# Patient Record
Sex: Male | Born: 1990 | Race: Black or African American | Hispanic: No | Marital: Single | Smoking: Never smoker
Health system: Southern US, Community
[De-identification: ages and names within clinical notes are randomized; demographics above are authoritative.]

## PROBLEM LIST (undated history)

## (undated) HISTORY — PX: EYE SURGERY: SHX253

---

## 2015-07-20 ENCOUNTER — Emergency Department (HOSPITAL_COMMUNITY): Payer: Medicaid - Out of State

## 2015-07-20 ENCOUNTER — Encounter (HOSPITAL_COMMUNITY): Payer: Self-pay | Admitting: *Deleted

## 2015-07-20 ENCOUNTER — Emergency Department (HOSPITAL_COMMUNITY)
Admission: EM | Admit: 2015-07-20 | Discharge: 2015-07-20 | Disposition: A | Discharge status: Home / Self Care | Payer: Medicaid - Out of State | Attending: Emergency Medicine | Admitting: Emergency Medicine

## 2015-07-20 DIAGNOSIS — F17200 Nicotine dependence, unspecified, uncomplicated: Secondary | ICD-10-CM | POA: Insufficient documentation

## 2015-07-20 DIAGNOSIS — N50811 Right testicular pain: Secondary | ICD-10-CM

## 2015-07-20 LAB — URINALYSIS, ROUTINE W REFLEX MICROSCOPIC
BILIRUBIN URINE: NEGATIVE
GLUCOSE, UA: NEGATIVE mg/dL
HGB URINE DIPSTICK: NEGATIVE
Ketones, ur: NEGATIVE mg/dL
Leukocytes, UA: NEGATIVE
Nitrite: NEGATIVE
PH: 7 (ref 5.0–8.0)
Protein, ur: NEGATIVE mg/dL
SPECIFIC GRAVITY, URINE: 1.014 (ref 1.005–1.030)

## 2015-07-20 NOTE — ED Notes (Signed)
See PA assessment, PA at bedside with RN 

## 2015-07-20 NOTE — ED Provider Notes (Signed)
CSN: [id number]   Arrival date & time 07/20/15 1212  History  By signing my name below, I, Bethel Born, attest that this documentation has been prepared under the direction and in the presence of Sharilyn Sites PA-C Electronically Signed: Bethel Born, ED Scribe. 07/20/2015. 1:20 PM. Chief Complaint  Patient presents with  . Testicle Pain    HPI The history is provided by the patient. No language interpreter was used.   Brad Mayo is a 24 y.o. male who presents to the Emergency Department complaining of new, atraumatic, intermittent, 6/10 in severity, burning, right-sided testicular pain with onset 2 days ago. The pain worsened after sexual intercourse.  Pt denies left-sided testicular pain, dysuria, penile discharge, and rash. He would like to be checked for STDs.  No hx of STD.  No intervention tried PTA.  VSS.  History reviewed. No pertinent past medical history.  History reviewed. No pertinent past surgical history.  History reviewed. No pertinent family history.  Social History  Substance Use Topics  . Smoking status: Current Every Day Smoker  . Smokeless tobacco: None  . Alcohol Use: None     Review of Systems  Genitourinary: Positive for testicular pain. Negative for dysuria, discharge, genital sores and penile pain.  Skin: Negative for rash.  All other systems reviewed and are negative.  Home Medications   Prior to Admission medications   Not on File    Allergies  Review of patient's allergies indicates no known allergies.  Triage Vitals: BP 141/76 mmHg  Pulse 56  Temp(Src) 97.9 F (36.6 C) (Oral)  Resp 18  Ht  (2.032 m)  Wt 229 lb 1.6 oz (103.919 kg)  BMI 25.17 kg/m2  SpO2 100%  Physical Exam  Constitutional: He is oriented to person, place, and time. He appears well-developed and well-nourished. No distress.  HENT:  Head: Normocephalic and atraumatic.  Mouth/Throat: Oropharynx is clear and moist.  Eyes: Conjunctivae and EOM are normal.  Pupils are equal, round, and reactive to light.  Neck: Normal range of motion. Neck supple.  Cardiovascular: Normal rate, regular rhythm and normal heart sounds.   Pulmonary/Chest: Effort normal and breath sounds normal. No respiratory distress. He has no wheezes.  Abdominal: Soft. Bowel sounds are normal. There is no tenderness. There is no guarding.  Genitourinary: Penis normal.    Right testis shows tenderness. Right testis shows no mass and no swelling. Right testis is descended. Cremasteric reflex is not absent on the right side. Left testis shows no tenderness. Circumcised. No penile erythema. No discharge found.  Very mild TTP of right lateral testicle; no masses or lesions noted; no swelling; normal lie; no penile discharge noted; no hernia  Musculoskeletal: Normal range of motion.  Neurological: He is alert and oriented to person, place, and time.  Skin: Skin is warm and dry. He is not diaphoretic.  Psychiatric: He has a normal mood and affect.  Nursing note and vitals reviewed.   ED Course  Procedures  DIAGNOSTIC STUDIES: Oxygen Saturation is 100% on RA,  normal by my interpretation.    COORDINATION OF CARE: 1:17 PM Discussed treatment plan which includes lab work and US of the scrotum, with pt at bedside and pt agreed to the plan.  Labs Review-  Labs Reviewed  URINALYSIS, ROUTINE W REFLEX MICROSCOPIC (NOT AT [facility])  GC/CHLAMYDIA PROBE AMP ([place]) NOT AT [facility]    Imaging Review US Scrotum  07/20/2015  CLINICAL DATA:  Right testicular pain for 3 days. EXAM: SCROTAL ULTRASOUND DOPPLER  ULTRASOUND OF THE TESTICLES TECHNIQUE: Complete ultrasound examination of the testicles, epididymis, and other scrotal structures was performed. Color and spectral Doppler ultrasound were also utilized to evaluate blood flow to the testicles. COMPARISON:  None. FINDINGS: Right testicle Measurements: 4.2 x 2.2 x 3.0 cm. No mass or microlithiasis visualized. Left testicle Measurements: 3.9 x  1.9 x 2.6 cm. No mass or microlithiasis visualized. Right epididymis:  Normal in size and appearance. Left epididymis:  Normal in size and appearance. Hydrocele:  None visualized. Varicocele:  There is bilateral varicocele. Pulsed Doppler interrogation of both testes demonstrates normal low resistance arterial and venous waveforms bilaterally. IMPRESSION: Normal appearance of bilateral testes. Bilateral varicocele. Electronically Signed   By: Ted Mcalpineobrinka  Dimitrova M.D.   On: 07/20/2015 13:59   [place] Art/ven Flow Abd Pelv Doppler  07/20/2015  CLINICAL DATA:  Right testicular pain for 3 days. EXAM: SCROTAL ULTRASOUND DOPPLER ULTRASOUND OF THE TESTICLES TECHNIQUE: Complete ultrasound examination of the testicles, epididymis, and other scrotal structures was performed. Color and spectral Doppler ultrasound were also utilized to evaluate blood flow to the testicles. COMPARISON:  None. FINDINGS: Right testicle Measurements: 4.2 x 2.2 x 3.0 cm. No mass or microlithiasis visualized. Left testicle Measurements: 3.9 x 1.9 x 2.6 cm. No mass or microlithiasis visualized. Right epididymis:  Normal in size and appearance. Left epididymis:  Normal in size and appearance. Hydrocele:  None visualized. Varicocele:  There is bilateral varicocele. Pulsed Doppler interrogation of both testes demonstrates normal low resistance arterial and venous waveforms bilaterally. IMPRESSION: Normal appearance of bilateral testes. Bilateral varicocele. Electronically Signed   By: Ted Mcalpineobrinka  Dimitrova M.D.   On: 07/20/2015 13:59    MDM   Final diagnoses:  Pain in right testicle   24 year old male here with right testicle pain. States this began after intercourse. Denies any penile discharge or urinary symptoms. No deformities noted on exam. Normal lie. No lesions or open wounds. UA non-infectious. Gc/chl pending.  U/s negative for acute torsion or other acute process, bilateral varicoceles noted.  Patient d/c home with urology follow-up if  needed for recurrent testicle pain.  Encouraged tylenol/motrin PRN.  Advised he will be notified in the next 2-3 days if culture results are positive.  Discussed plan with patient, he/she acknowledged understanding and agreed with plan of care.  Return precautions given for new or worsening symptoms.  I personally performed the services described in this documentation, which was scribed in my presence. The recorded information has been reviewed and is accurate.  Garlon HatchetLisa M Shaft Corigliano, PA-C 07/20/15 1440  Leta BaptistEmily Roe Nguyen, MD 07/20/15 2352

## 2015-07-20 NOTE — Discharge Instructions (Signed)
May take tylenol or motrin as needed for pain. You will be notified if cultures are positive-- if so, you will be given antibiotics. May follow-up with urology if you continue have issues with testicle pain. Return here for new concerns.

## 2015-07-20 NOTE — ED Notes (Addendum)
Pt in c/o right testicle burning that has been intermittent for the past few days, increased after intercourse, denies penile discharge, denies redness or swelling, no distress noted

## 2015-07-20 NOTE — ED Notes (Signed)
Pt in ultrasound

## 2015-07-23 LAB — GC/CHLAMYDIA PROBE AMP (~~LOC~~) NOT AT ARMC
Chlamydia: NEGATIVE
Neisseria Gonorrhea: NEGATIVE

## 2015-08-02 ENCOUNTER — Encounter (HOSPITAL_COMMUNITY): Payer: Self-pay | Admitting: Emergency Medicine

## 2015-08-02 ENCOUNTER — Emergency Department (HOSPITAL_COMMUNITY)
Admission: EM | Admit: 2015-08-02 | Discharge: 2015-08-02 | Disposition: A | Discharge status: Home / Self Care | Payer: Medicaid - Out of State | Attending: Emergency Medicine | Admitting: Emergency Medicine

## 2015-08-02 DIAGNOSIS — R42 Dizziness and giddiness: Secondary | ICD-10-CM | POA: Insufficient documentation

## 2015-08-02 DIAGNOSIS — R110 Nausea: Secondary | ICD-10-CM | POA: Insufficient documentation

## 2015-08-02 LAB — URINALYSIS, ROUTINE W REFLEX MICROSCOPIC
Bilirubin Urine: NEGATIVE
Glucose, UA: NEGATIVE mg/dL
Hgb urine dipstick: NEGATIVE
Ketones, ur: NEGATIVE mg/dL
Leukocytes, UA: NEGATIVE
Nitrite: NEGATIVE
Protein, ur: NEGATIVE mg/dL
Specific Gravity, Urine: 1.02 (ref 1.005–1.030)
pH: 6 (ref 5.0–8.0)

## 2015-08-02 LAB — COMPREHENSIVE METABOLIC PANEL
ALT: 16 U/L — ABNORMAL LOW (ref 17–63)
AST: 25 U/L (ref 15–41)
Albumin: 4.1 g/dL (ref 3.5–5.0)
Alkaline Phosphatase: 72 U/L (ref 38–126)
Anion gap: 8 (ref 5–15)
BUN: 12 mg/dL (ref 6–20)
CO2: 28 mmol/L (ref 22–32)
Calcium: 10 mg/dL (ref 8.9–10.3)
Chloride: 103 mmol/L (ref 101–111)
Creatinine, Ser: 1.12 mg/dL (ref 0.61–1.24)
GFR calc Af Amer: >60 mL/min (ref >60)
GFR calc non Af Amer: >60 mL/min (ref >60)
Glucose, Bld: 92 mg/dL (ref 65–99)
Potassium: 3.8 mmol/L (ref 3.5–5.1)
Sodium: 139 mmol/L (ref 135–145)
Total Bilirubin: 1.1 mg/dL (ref 0.3–1.2)
Total Protein: 7.5 g/dL (ref 6.5–8.1)

## 2015-08-02 LAB — CBC
HCT: 45.8 % (ref 39.0–52.0)
Hemoglobin: 15.5 g/dL (ref 13.0–17.0)
MCH: 28.1 pg (ref 26.0–34.0)
MCHC: 33.8 g/dL (ref 30.0–36.0)
MCV: 83 fL (ref 78.0–100.0)
Platelets: 168 10*3/uL (ref 150–400)
RBC: 5.52 MIL/uL (ref 4.22–5.81)
RDW: 13.2 % (ref 11.5–15.5)
WBC: 4.9 10*3/uL (ref 4.0–10.5)

## 2015-08-02 LAB — LIPASE, BLOOD: Lipase: 20 U/L (ref 11–51)

## 2015-08-02 MED ORDER — PROMETHAZINE HCL 25 MG PO TABS: 25.0000 mg | ORAL_TABLET | Freq: Four times a day (QID) | ORAL | Status: DC | PRN

## 2015-08-02 MED ORDER — MECLIZINE HCL 50 MG PO TABS: 25.0000 mg | ORAL_TABLET | Freq: Four times a day (QID) | ORAL | Status: DC | PRN

## 2015-08-02 MED ORDER — PROMETHAZINE HCL 12.5 MG PO TABS
12.5000 mg | ORAL_TABLET | Freq: Once | ORAL | Status: DC
  Filled 2015-08-02: qty 1

## 2015-08-02 NOTE — ED Notes (Signed)
Pt states 1 week ago while working out he became very dizzy since then  He was had intermittent dizziness that comes and goes. 3 days ago pt states he was so dizzy he became nauseous and has been vomiting after eating every day after. No vomitting today. Denies any abdominal pain. Pt states every time he has stood up today he has gotten dizzy.no headache or change in vision.

## 2015-08-02 NOTE — Discharge Instructions (Signed)

## 2015-08-04 NOTE — ED Provider Notes (Signed)
CSN: [id number]     Arrival date & time 08/02/15  1634 History   First MD Initiated Contact with Patient 08/02/15 1858     Chief Complaint  Patient presents with  . Dizziness     (Consider location/radiation/quality/duration/timing/severity/associated sxs/prior Treatment) HPI   24 year old male with dizziness. Onset about a week ago. First felt shortly after he worked out. He felt fine during her short but self. Since then he has had intermittent episodes of what he calls dizziness. Describes sensation of feeling off balance. Nauseated. Sensation that the room is spinning. Seems to be worse with changes in position. Denies any acute pain. No fevers or chills. No ear fullness. No tenderness. No history similar type symptoms. History reviewed. No pertinent past medical history. History reviewed. No pertinent past surgical history. No family history on file. Social History  Substance Use Topics  . Smoking status: Never Smoker   . Smokeless tobacco: None  . Alcohol Use: No    Review of Systems  All systems reviewed and negative, other than as noted in HPI.   Allergies  Review of patient's allergies indicates no known allergies.  Home Medications   Prior to Admission medications   Medication Sig Start Date End Date Taking? Authorizing Provider  meclizine (ANTIVERT) 50 MG tablet Take 0.5 tablets (25 mg total) by mouth 4 (four) times daily as needed for dizziness. 08/02/15   Raeford RazorStephen Velmer Woelfel, MD  promethazine (PHENERGAN) 25 MG tablet Take 1 tablet (25 mg total) by mouth every 6 (six) hours as needed for nausea or vomiting. 08/02/15   Raeford RazorStephen Dae Antonucci, MD   BP 134/70 mmHg  Pulse 56  Temp(Src) 98.1 F (36.7 C) (Oral)  Resp 16  Ht 6\' 8"  (2.032 m)  Wt 230 lb (104.327 kg)  BMI 25.27 kg/m2  SpO2 98% Physical Exam  Constitutional: He is oriented to person, place, and time. He appears well-developed and well-nourished. No distress.  HENT:  Head: Normocephalic and atraumatic.  Tympanic  membranes and external auditory canals are clear bilaterally.  Eyes: Conjunctivae are normal. Right eye exhibits no discharge. Left eye exhibits no discharge.  Neck: Neck supple.  Cardiovascular: Normal rate, regular rhythm and normal heart sounds.  Exam reveals no gallop and no friction rub.   No murmur heard. Pulmonary/Chest: Effort normal and breath sounds normal. No respiratory distress.  Abdominal: Soft. He exhibits no distension. There is no tenderness.  Musculoskeletal: He exhibits no edema or tenderness.  Neurological: He is alert and oriented to person, place, and time. No cranial nerve deficit. He exhibits normal muscle tone. Coordination normal.  Speech clear. Content appropriate. Follows commands. Cranial nerves II through XII are intact. Strength is 5 out of 5 bilateral upper lower extremities. Sensation is intact to light touch. Good finger to nose testing bilaterally. Gait is steady.  Skin: Skin is warm and dry.  Psychiatric: He has a normal mood and affect. His behavior is normal. Thought content normal.  Nursing note and vitals reviewed.   ED Course  Procedures (including critical care time) Labs Review Labs Reviewed  COMPREHENSIVE METABOLIC PANEL - Abnormal; Notable for the following:    ALT 16 (*)    All other components within normal limits  URINALYSIS, ROUTINE W REFLEX MICROSCOPIC (NOT AT [facility]) - Abnormal; Notable for the following:    APPearance CLOUDY (*)    All other components within normal limits  LIPASE, BLOOD  CBC    Imaging Review No results found. I have personally reviewed and evaluated these images  and lab results as part of my medical decision-making.   EKG Interpretation   Date/Time:  Thursday August 02 2015 19:19:53 EST Ventricular Rate:  54 PR Interval:  164 QRS Duration: 91 QT Interval:  420 QTC Calculation: 398 R Axis:   87 Text Interpretation:  Sinus rhythm ST elev, probable normal early repol  pattern No old tracing to compare  Confirmed by Juleen China  MD, Trica Usery [phone])  on 08/02/2015 8:03:23 PM      MDM   Final diagnoses:  Nausea  Vertigo    24 year old male what strikes me as vertigo. Symptoms positional. Describes room spinning. Associated nausea. Feeling of being off balance. Denies any headaches. He has a nonfocal neurological examination. Plan symptomatic treatment at this time. Low suspicion for central etiology or other emergent process.    Raeford Razor, MD 08/04/15 [phone]

## 2017-02-08 IMAGING — US US ART/VEN ABD/PELV/SCROTUM DOPPLER LTD
1 series · 14 of 25 positions shown · non-contrast
Comparison: None.

CLINICAL DATA: Right testicular pain for 3 days.

EXAM:
SCROTAL ULTRASOUND
DOPPLER ULTRASOUND OF THE TESTICLES
TECHNIQUE: Complete ultrasound examination of the testicles, epididymis, and
other scrotal structures was performed. Color and spectral Doppler
ultrasound were also utilized to evaluate blood flow to the
testicles.

[Series 1: us art/ven abd/pelv/scrotum doppler ltd · 0.06mm/px · 14 of 43 slices shown]
[im 1/43]
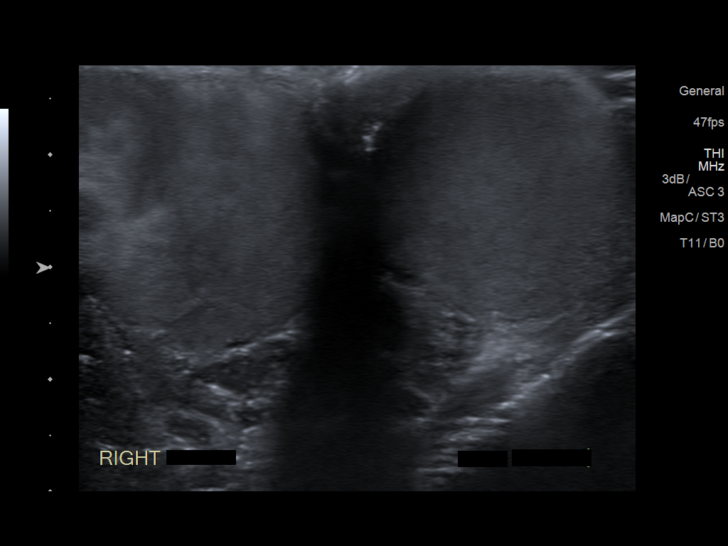
[im 4/43]
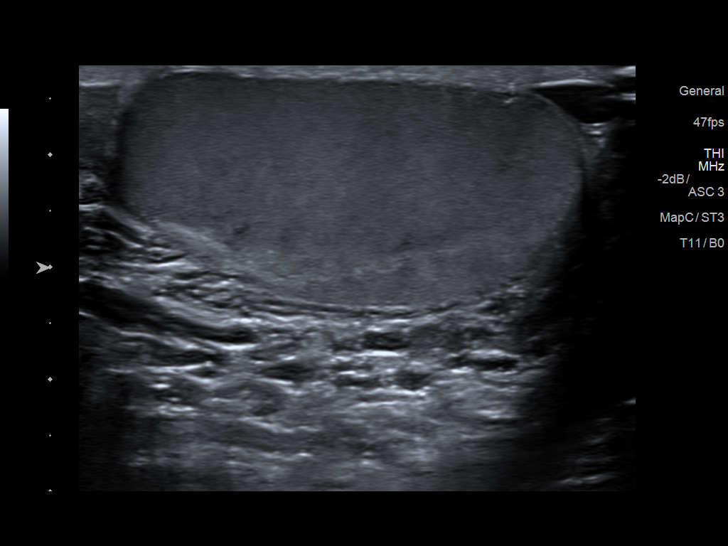
[im 8/43]
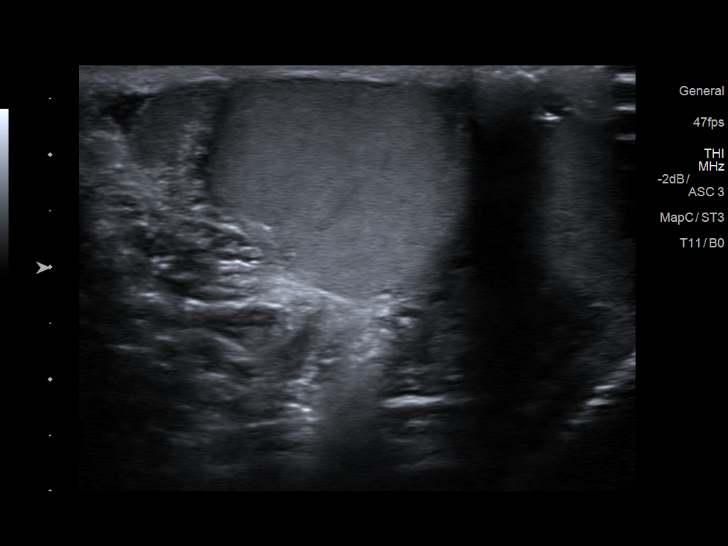
[im 11/43]
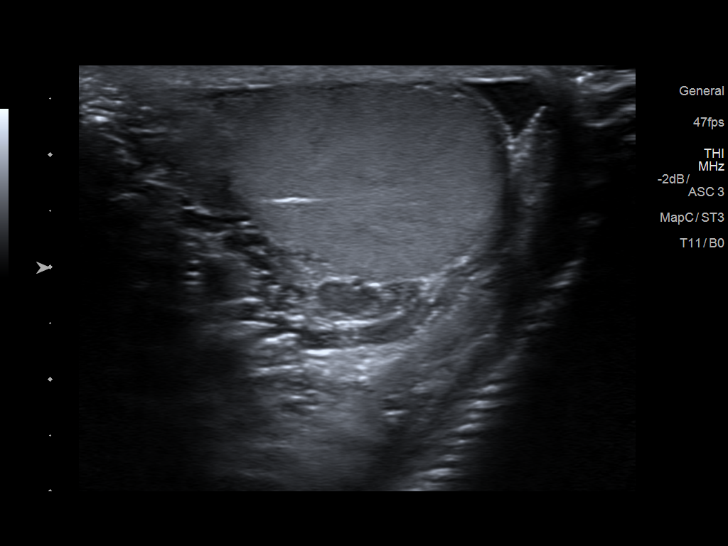
[im 15/43]
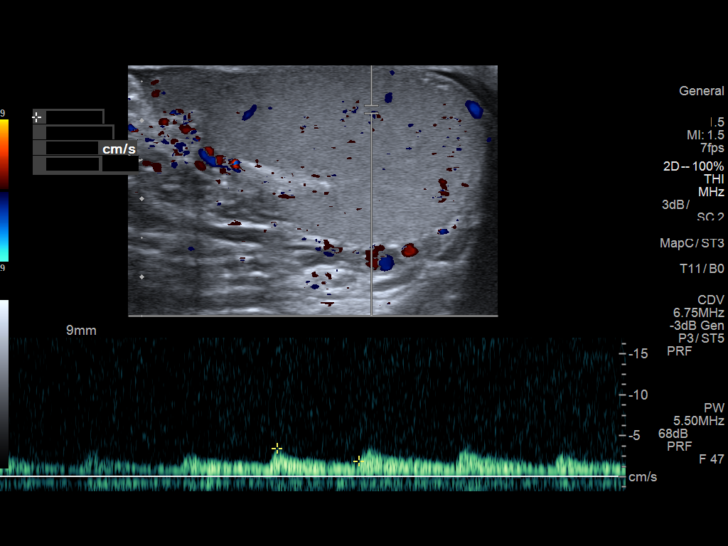
[im 16/43]
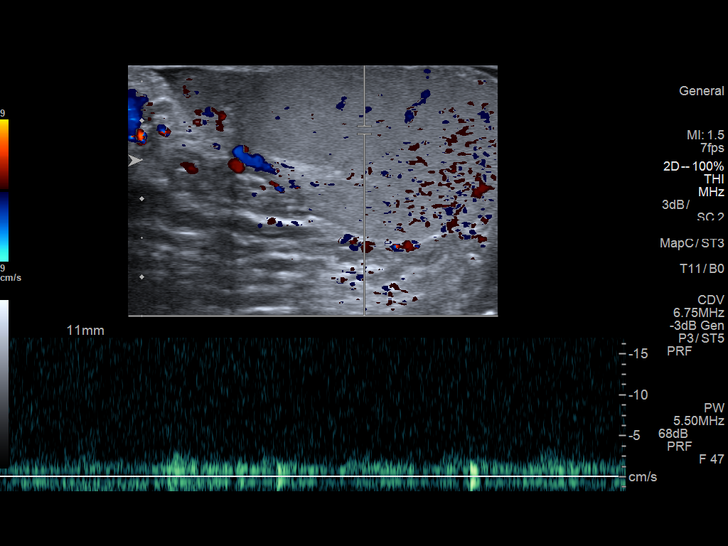
[im 20/43]
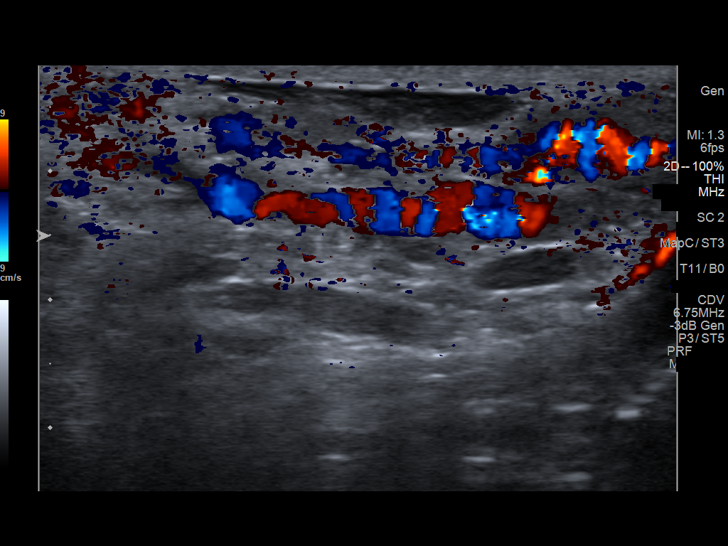
[im 23/43]
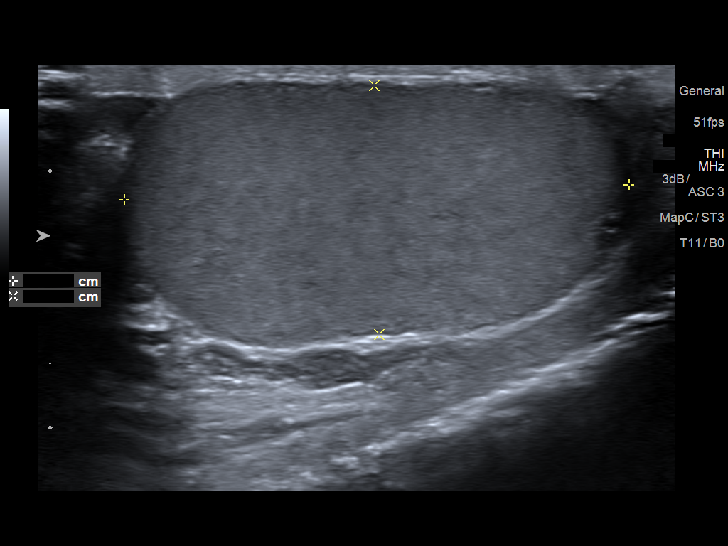
[im 27/43]
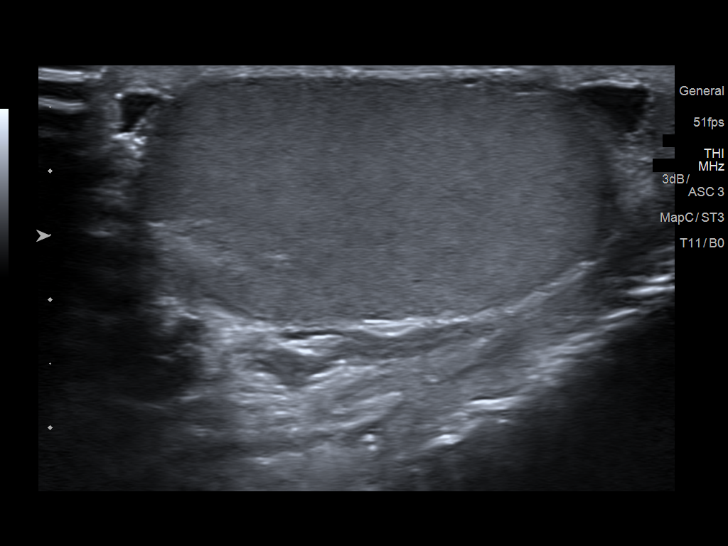
[im 29/43]
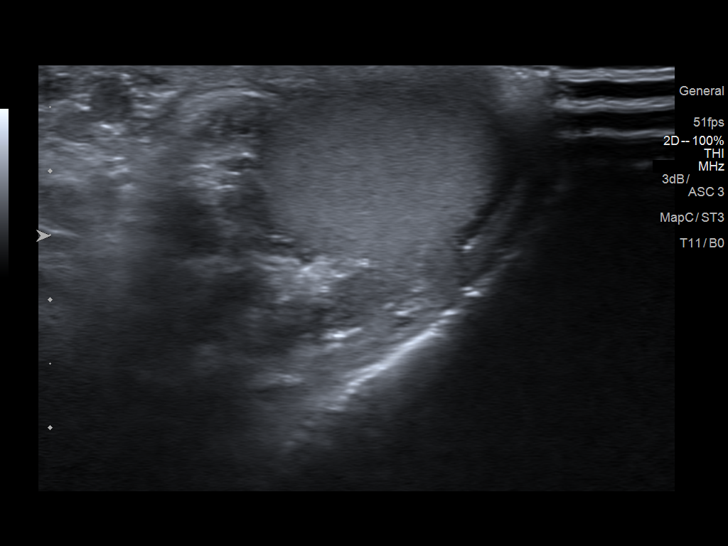
[im 32/43]
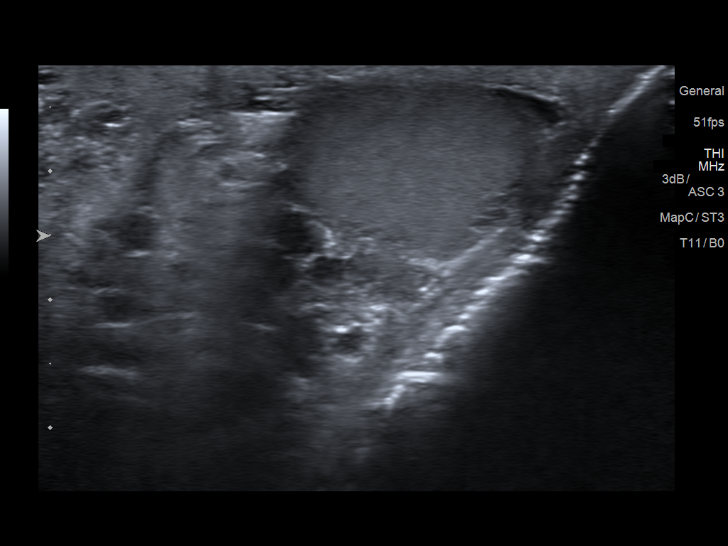
[im 36/43]
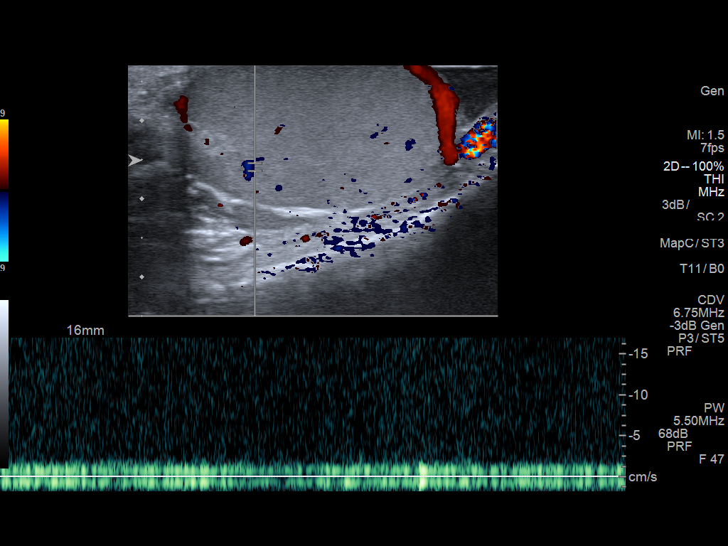
[im 39/43]
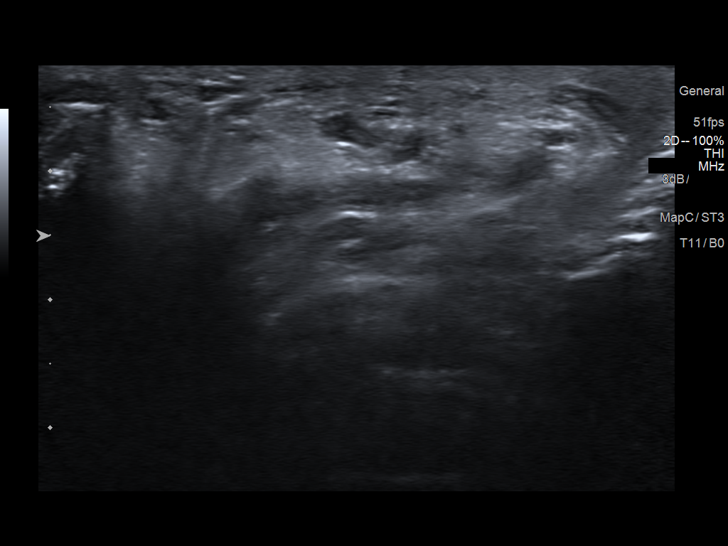
[im 43/43]
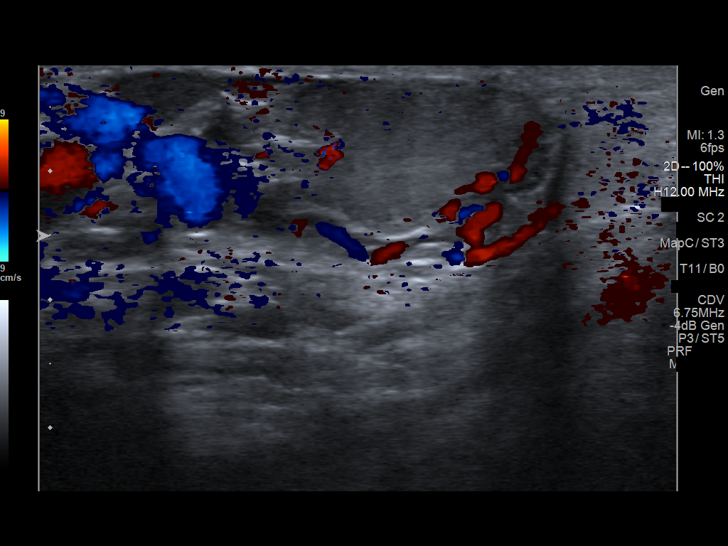

[14 of 25 positions shown; findings below may reference images not displayed]

FINDINGS: Right testicle

Measurements: 4.2 x 2.2 x 3.0 cm. No mass or microlithiasis
visualized.

Left testicle

Measurements: 3.9 x 1.9 x 2.6 cm. No mass or microlithiasis
visualized.

Right epididymis:  Normal in size and appearance.

Left epididymis:  Normal in size and appearance.

Hydrocele:  None visualized.

Varicocele:  There is bilateral varicocele.

Pulsed Doppler interrogation of both testes demonstrates normal low
resistance arterial and venous waveforms bilaterally.
IMPRESSION: Normal appearance of bilateral testes.

Bilateral varicocele.

## 2017-06-11 ENCOUNTER — Encounter (HOSPITAL_BASED_OUTPATIENT_CLINIC_OR_DEPARTMENT_OTHER): Payer: Self-pay | Admitting: *Deleted

## 2017-06-11 ENCOUNTER — Emergency Department (HOSPITAL_BASED_OUTPATIENT_CLINIC_OR_DEPARTMENT_OTHER)
Admission: EM | Admit: 2017-06-11 | Discharge: 2017-06-11 | Disposition: A | Discharge status: Home / Self Care | Payer: Medicaid - Out of State | Attending: Emergency Medicine | Admitting: Emergency Medicine

## 2017-06-11 DIAGNOSIS — R51 Headache: Secondary | ICD-10-CM | POA: Insufficient documentation

## 2017-06-11 DIAGNOSIS — R112 Nausea with vomiting, unspecified: Secondary | ICD-10-CM

## 2017-06-11 DIAGNOSIS — R519 Headache, unspecified: Secondary | ICD-10-CM

## 2017-06-11 LAB — COMPREHENSIVE METABOLIC PANEL
ALBUMIN: 4.5 g/dL (ref 3.5–5.0)
ALT: 19 U/L (ref 17–63)
ANION GAP: 10 (ref 5–15)
AST: 32 U/L (ref 15–41)
Alkaline Phosphatase: 43 U/L (ref 38–126)
BILIRUBIN TOTAL: 0.8 mg/dL (ref 0.3–1.2)
BUN: 18 mg/dL (ref 6–20)
CO2: 24 mmol/L (ref 22–32)
Calcium: 9.6 mg/dL (ref 8.9–10.3)
Chloride: 104 mmol/L (ref 101–111)
Creatinine, Ser: 1.04 mg/dL (ref 0.61–1.24)
GFR calc non Af Amer: >60 mL/min (ref >60)
GLUCOSE: 146 mg/dL — AB (ref 65–99)
POTASSIUM: 3.2 mmol/L — AB (ref 3.5–5.1)
SODIUM: 138 mmol/L (ref 135–145)
TOTAL PROTEIN: 7.7 g/dL (ref 6.5–8.1)

## 2017-06-11 LAB — CBC
HEMATOCRIT: 44 % (ref 39.0–52.0)
HEMOGLOBIN: 15.5 g/dL (ref 13.0–17.0)
MCH: 28.9 pg (ref 26.0–34.0)
MCHC: 35.2 g/dL (ref 30.0–36.0)
MCV: 82.1 fL (ref 78.0–100.0)
Platelets: 158 10*3/uL (ref 150–400)
RBC: 5.36 MIL/uL (ref 4.22–5.81)
RDW: 12.7 % (ref 11.5–15.5)
WBC: 7.8 10*3/uL (ref 4.0–10.5)

## 2017-06-11 LAB — OCCULT BLOOD X 1 CARD TO LAB, STOOL: FECAL OCCULT BLD: NEGATIVE

## 2017-06-11 LAB — LIPASE, BLOOD: Lipase: 19 U/L (ref 11–51)

## 2017-06-11 MED ORDER — ONDANSETRON HCL 4 MG PO TABS: 4.0000 mg | ORAL_TABLET | Freq: Three times a day (TID) | ORAL | 0 refills | Status: DC | PRN

## 2017-06-11 MED ORDER — ONDANSETRON 4 MG PO TBDP
4.0000 mg | ORAL_TABLET | Freq: Once | ORAL | Status: AC | PRN
  Administered 2017-06-11: 4 mg via ORAL
  Filled 2017-06-11: qty 1

## 2017-06-11 MED ORDER — SODIUM CHLORIDE 0.9 % IV BOLUS (SEPSIS)
1000.0000 mL | Freq: Once | INTRAVENOUS | Status: AC
  Administered 2017-06-11: 1000 mL via INTRAVENOUS

## 2017-06-11 MED ORDER — METOCLOPRAMIDE HCL 5 MG/ML IJ SOLN
10.0000 mg | Freq: Once | INTRAMUSCULAR | Status: AC
  Administered 2017-06-11: 10 mg via INTRAVENOUS
  Filled 2017-06-11: qty 2

## 2017-06-11 MED ORDER — DIPHENHYDRAMINE HCL 50 MG/ML IJ SOLN
25.0000 mg | Freq: Once | INTRAMUSCULAR | Status: AC
Start: 2017-06-11 — End: 2017-06-11
  Administered 2017-06-11: 25 mg via INTRAVENOUS
  Filled 2017-06-11: qty 1

## 2017-06-11 MED ORDER — KETOROLAC TROMETHAMINE 30 MG/ML IJ SOLN
30.0000 mg | Freq: Once | INTRAMUSCULAR | Status: AC
Start: 2017-06-11 — End: 2017-06-11
  Administered 2017-06-11: 30 mg via INTRAVENOUS
  Filled 2017-06-11: qty 1

## 2017-06-11 NOTE — ED Notes (Signed)
Pt. Reports feeling better with no nausea at present time.

## 2017-06-11 NOTE — Discharge Instructions (Signed)
Please read and follow all provided instructions.  You were seen here today for Nausea and Vomiting  Tests performed today include: Blood Work  Vital signs. See below for your results today.   Home Care Instructions Continue to hydrate orally. Take all medications as prescribed & use Zofran as directed for nausea & vomiting.  Read the instructions below for reasons to return to the ER. Do not take your medicine if develop an itchy rash, swelling in your mouth or lips, or difficulty breathing  The 'BRAT' diet is suggested, then progress to diet as tolerated as symptoms abate. Call if bloody stools, persistent diarrhea, vomiting, fever or abdominal pain. Bananas.  Rice.  Applesauce.  Toast (and other simple starches such as crackers, potatoes, noodles).   Follow any educational materials contained in this packet.  Follow-up instructions: Please follow-up with your primary care provider for further evaluation of symptoms and treatment this week.    Return instructions:  Please return to the Emergency Department if you do not get better, if you get worse, or new symptoms OR You begin having localized abdominal pain that does not go away or becomes severe (The right side could  possibly be appendicitis. In an adult, the left lower portion of the abdomen could be colitis or diverticulitis) A temperature above 101 develops Repeated vomiting occurs (multiple uncontrollable episodes) or you are unable to keep fluids down Blood is being passed in stools or vomit (bright red or black tarry stools).  Return also if you develop chest pain, difficulty breathing, dizziness or fainting, or become confused, poorly responsive, or inconsolable (young children). You develop stiff neck Please return if you have any other emergent concerns.  Additional Information:  Your vital signs today were: BP (!) 141/73 (BP Location: Left Arm)    Pulse 88    Temp (!) 100.7 F (38.2 C) (Oral)    Resp 16    Ht 6\' 6"   (1.981 m)    Wt 99.8 kg (220 lb)    SpO2 100%    BMI 25.42 kg/m  If your blood pressure (BP) was elevated above 135/85 this visit, please have this repeated by your doctor within one month. ---------------

## 2017-06-11 NOTE — ED Provider Notes (Signed)
[facility] EMERGENCY DEPARTMENT Provider Note   CSN: [id number] Arrival date & time: 06/11/17  1435     History   Chief Complaint Chief Complaint  Patient presents with  . Abdominal Pain  . Emesis    HPI Brad Mayo is a 26 y.o. male with no significant past medical history who presents to the emergency department today for nausea and vomiting. The patient states that this morning approximately one hour after having a [employer] sandwich he developed nausea followed by emesis. He has had 7 total episodes of non-bilious, non-bloody emesis today, with the last episode occurring 10 minutes prior to eval. He notes that he has tried Tylenol, ginger ale, and small sips of water for this without any relief. He reported to triage that he has been having abdominal pain. He describes this to me as abdominal pain/cramping that occurs while the patient is vomiting. The pain is generalized and relieved shortly after he is done vomiting. No current abdominal pain or localized pain. He also reports a HA that began after several episodes of emesis. He says it is in the left temporal region of his head, throbbing, and rated as a 5/10. The patient is a daily Marijuina smoker, reports to triage last smoked today, but to me that he last smoked yesterday. No history of abdominal surgeries. LBM this morning. Does report that he has had one week of dark, tarry stools. He has not been taking pepto-bismol or iron supplements. No alcohol or NSAID use. He denies fever, diarrhea, dysuria, flank pain, suprapubic pain, frequency, urgency, hematuria, testicular pain, penile pain, or penile discharge. No painful BM. No sick contacts. No one with similar symptoms.   HA pert negative Denies fever, syncope, head trauma, photophobia, phonophobia, visual changes, stiff neck, neck pain, rash, seizure, jaw claudication, or "thunderclap" onset. Not first HA. Not worst HA of life. Similar to previous headaches in the  past. No one in party with similar HA.     HPI  History reviewed. No pertinent past medical history.  There are no active problems to display for this patient.   Past Surgical History:  Procedure Laterality Date  . EYE SURGERY         Home Medications    Prior to Admission medications   Medication Sig Start Date End Date Taking? Authorizing Provider  meclizine (ANTIVERT) 50 MG tablet Take 0.5 tablets (25 mg total) by mouth 4 (four) times daily as needed for dizziness. 08/02/15   Raeford RazorKohut, Stephen, MD  promethazine (PHENERGAN) 25 MG tablet Take 1 tablet (25 mg total) by mouth every 6 (six) hours as needed for nausea or vomiting. 08/02/15   Raeford RazorKohut, Stephen, MD    Family History No family history on file.  Social History Social History  Substance Use Topics  . Smoking status: Never Smoker  . Smokeless tobacco: Never Used  . Alcohol use No     Allergies   Patient has no known allergies.   Review of Systems Review of Systems  All other systems reviewed and are negative.    Physical Exam Updated Vital Signs BP 137/74   Pulse 86   Temp 97.9 F (36.6 C) (Oral)   Resp 16   Ht 6\' 6"  (1.981 m)   Wt 99.8 kg (220 lb)   SpO2 100%   BMI 25.42 kg/m   Physical Exam  Constitutional: He appears well-developed and well-nourished.  HENT:  Head: Normocephalic and atraumatic.  Right Ear: External ear normal.  Left Ear: External  ear normal.  Nose: Nose normal.  Mouth/Throat: Uvula is midline, oropharynx is clear and moist and mucous membranes are normal. No tonsillar exudate.  Eyes: Pupils are equal, round, and reactive to light. Right eye exhibits no discharge. Left eye exhibits no discharge. No scleral icterus.  Neck: Trachea normal. Neck supple. No spinous process tenderness present. No neck rigidity. Normal range of motion present.  No meningismus  Cardiovascular: Normal rate, regular rhythm and intact distal pulses.   No murmur heard. Pulses:      Radial pulses are 2+  on the right side, and 2+ on the left side.       Dorsalis pedis pulses are 2+ on the right side, and 2+ on the left side.       Posterior tibial pulses are 2+ on the right side, and 2+ on the left side.  No lower extremity swelling or edema. Calves symmetric in size bilaterally.  Pulmonary/Chest: Effort normal and breath sounds normal. He exhibits no tenderness.  Abdominal: Soft. Normal appearance and bowel sounds are normal. There is no tenderness. There is no rigidity, no rebound, no guarding, no CVA tenderness, no tenderness at McBurney's point and negative Murphy's sign.  No abdominal TTP with light or deep palpation.   Genitourinary:  Genitourinary Comments: Chaperone was present.  Patient without pain around the rectal area. Perianal sensory intact. No external fissure's palpated or examined. No external hemorrhoids noted. No induration of the skin or swelling. Digital Rectal Exam reveals sphincter with good tone. No masses palpated. Stool color is brown with no overt blood or melena.  Musculoskeletal: He exhibits no edema.  Lymphadenopathy:    He has no cervical adenopathy.  Neurological: He is alert.  Speech clear. Follows commands. No facial droop. PERRLA. EOMI. Normal peripheral fields. CN III-XII intact.  Grossly moves all extremities 4 without ataxia. Coordination intact. Able and appropriate strength for age to upper and lower extremities bilaterally including grip strength. Sensation to light touch intact bilaterally for upper and lower. Normal finger to nose. Normal heel to shin balance. No pronator drift.  Skin: Skin is warm and dry. No rash noted. He is not diaphoretic.  Psychiatric: He has a normal mood and affect.  Nursing note and vitals reviewed.    ED Treatments / Results  Labs (all labs ordered are listed, but only abnormal results are displayed) Labs Reviewed  COMPREHENSIVE METABOLIC PANEL - Abnormal; Notable for the following:       Result Value   Potassium 3.2  (*)    Glucose, Bld 146 (*)    All other components within normal limits  LIPASE, BLOOD  CBC  OCCULT BLOOD X 1 CARD TO LAB, STOOL    EKG  EKG Interpretation None       Radiology No results found.  Procedures Procedures (including critical care time)  Medications Ordered in ED Medications  ondansetron (ZOFRAN-ODT) disintegrating tablet 4 mg (4 mg Oral Given 06/11/17 1541)  metoCLOPramide (REGLAN) injection 10 mg (10 mg Intravenous Given 06/11/17 1658)  ketorolac (TORADOL) 30 MG/ML injection 30 mg (30 mg Intravenous Given 06/11/17 1657)  diphenhydrAMINE (BENADRYL) injection 25 mg (25 mg Intravenous Given 06/11/17 1658)  sodium chloride 0.9 % bolus 1,000 mL (0 mLs Intravenous Stopped 06/11/17 1848)     Initial Impression / Assessment and Plan / ED Course  I have reviewed the triage vital signs and the nursing notes.  Pertinent labs & imaging results that were available during my care of the patient were reviewed by  me and considered in my medical decision making (see chart for details).     26 y.o. male with N/V since this morning after eating at [employer]. Patent also with HA that he states is similar to previous HA. No abdominal pain on exam. Vitals reassuring and without fever, tachycardia, hypotension. Patient is nontoxic, nonseptic appearing and in no apparent distress. Patient reported melanous stool but no risk factors for PUD and does not have history of GI bleed. Occult blood card negative and DRE without evidence of blood or melena. Labs drawn in triage show lipase wnl, mild hypokalemia, mild hyperglycemia and unremarkable CBC. Hypokalemia likely due to emesis. Hyperglycemia without anion gap acidosis. Do not suspect DKA. Pt HA treated and improved while in ED after IVF, nausea and pain medication.  Presentation is like pts typical HA and non concerning for [facility], ICH, Meningitis, or temporal arteritis. Pt is with no focal neuro deficits, nuchal rigidity, or change in  vision. Nausea resolved and patient able to tolerate PO. On repeat exam patient does not have a surgical abdomin and there are no peritoneal signs. He is without abdominal TTP. Patient will be discharged home with conservative tx and is to follow up with PCP. Strict return precautions discussed. Pt verbalizes understanding and is agreeable with plan to dc.   Final Clinical Impressions(s) / ED Diagnoses   Final diagnoses:  Non-intractable vomiting with nausea, unspecified vomiting type  Nonintractable headache, unspecified chronicity pattern, unspecified headache type    New Prescriptions Discharge Medication List as of 06/11/2017  6:52 PM    START taking these medications   Details  ondansetron (ZOFRAN) 4 MG tablet Take 1 tablet (4 mg total) by mouth every 8 (eight) hours as needed for nausea or vomiting., Starting Thu 06/11/2017, Print         Jacinto Halim, PA-C 06/13/17 0244    Tegeler, Canary Brim, MD 06/13/17 1122

## 2017-06-11 NOTE — ED Notes (Signed)
Pt yelling out in waiting room, demanding a room. This nurse explained to pt we were working as quickly as possible to get him a room.

## 2017-06-11 NOTE — ED Notes (Signed)
Pt verbalizes understanding of d/c instructions and denies any further need at this time. 

## 2017-06-11 NOTE — ED Triage Notes (Signed)
Abdominal pain and vomiting. Last marijuana was this am. Daily use.

## 2017-06-11 NOTE — ED Notes (Signed)
Pt. Resting with eyes closed and friends at bedside.

## 2018-04-09 ENCOUNTER — Emergency Department (HOSPITAL_COMMUNITY): Payer: Medicaid - Out of State

## 2018-04-09 ENCOUNTER — Emergency Department (HOSPITAL_COMMUNITY)
Admission: EM | Admit: 2018-04-09 | Discharge: 2018-04-09 | Disposition: A | Discharge status: Home / Self Care | Payer: Medicaid - Out of State | Attending: Emergency Medicine | Admitting: Emergency Medicine

## 2018-04-09 DIAGNOSIS — S93105A Unspecified dislocation of left toe(s), initial encounter: Secondary | ICD-10-CM

## 2018-04-09 DIAGNOSIS — S93112A Dislocation of interphalangeal joint of left great toe, initial encounter: Secondary | ICD-10-CM | POA: Insufficient documentation

## 2018-04-09 DIAGNOSIS — Y998 Other external cause status: Secondary | ICD-10-CM | POA: Insufficient documentation

## 2018-04-09 DIAGNOSIS — Y929 Unspecified place or not applicable: Secondary | ICD-10-CM | POA: Insufficient documentation

## 2018-04-09 DIAGNOSIS — Y9367 Activity, basketball: Secondary | ICD-10-CM | POA: Insufficient documentation

## 2018-04-09 DIAGNOSIS — X500XXA Overexertion from strenuous movement or load, initial encounter: Secondary | ICD-10-CM | POA: Insufficient documentation

## 2018-04-09 MED ORDER — IBUPROFEN 800 MG PO TABS: 800.0000 mg | ORAL_TABLET | Freq: Four times a day (QID) | ORAL | Status: DC | PRN

## 2018-04-09 MED ORDER — LIDOCAINE HCL (PF) 1 % IJ SOLN
5.0000 mL | Freq: Once | INTRAMUSCULAR | Status: AC
  Administered 2018-04-09: 5 mL
  Filled 2018-04-09: qty 30

## 2018-04-09 MED ORDER — IBUPROFEN 800 MG PO TABS: 800.0000 mg | ORAL_TABLET | Freq: Three times a day (TID) | ORAL | 0 refills | Status: AC | PRN

## 2018-04-09 MED ORDER — HYDROCODONE-ACETAMINOPHEN 5-325 MG PO TABS
1.0000 | ORAL_TABLET | Freq: Once | ORAL | Status: AC
  Administered 2018-04-09: 1 via ORAL
  Filled 2018-04-09: qty 1

## 2018-04-09 NOTE — ED Notes (Signed)
Discharge instructions reviewed with pt. Pt verbalized understanding. Pt to follow up with ortho. Pt ambulatory to waiting room.

## 2018-04-09 NOTE — ED Provider Notes (Signed)
[name] Provider Note   CSN: [id number] Arrival date & time: 04/09/18  1856     History   Chief Complaint Chief Complaint  Patient presents with  . Foot Injury    HPI Brad Mayo is a 27 y.o. male.  Patient hurt left big toe while playing basketball today. States that he feels like it is dislocated.  The history is provided by the patient.  Foot Pain  This is a new problem. The current episode started less than 1 hour ago. The problem occurs constantly. The problem has not changed since onset.Pertinent negatives include no chest pain, no abdominal pain, no headaches and no shortness of breath. The symptoms are aggravated by walking. Nothing relieves the symptoms. He has tried nothing for the symptoms. The treatment provided no relief.    No past medical history on file.  There are no active problems to display for this patient.   Past Surgical History:  Procedure Laterality Date  . EYE SURGERY          Home Medications    Prior to Admission medications   Medication Sig Start Date End Date Taking? Authorizing Provider  ibuprofen (ADVIL,MOTRIN) 800 MG tablet Take 1 tablet (800 mg total) by mouth every 8 (eight) hours as needed for up to 30 doses. 04/09/18   Virgina Norfolk, DO    Family History No family history on file.  Social History Social History   Tobacco Use  . Smoking status: Never Smoker  . Smokeless tobacco: Never Used  Substance Use Topics  . Alcohol use: No  . Drug use: Yes    Types: Marijuana     Allergies   Patient has no known allergies.   Review of Systems Review of Systems  Constitutional: Negative for chills and fever.  HENT: Negative for ear pain and sore throat.   Eyes: Negative for pain and visual disturbance.  Respiratory: Negative for cough and shortness of breath.   Cardiovascular: Negative for chest pain and palpitations.  Gastrointestinal: Negative for abdominal pain and vomiting.    Genitourinary: Negative for dysuria and hematuria.  Musculoskeletal: Positive for gait problem and joint swelling. Negative for arthralgias and back pain.  Skin: Negative for color change and rash.  Neurological: Negative for seizures, syncope and headaches.  All other systems reviewed and are negative.    Physical Exam Updated Vital Signs  ED Triage Vitals  Enc Vitals Group     BP 04/09/18 1907 130/67     Pulse Rate 04/09/18 1907 80     Resp 04/09/18 1907 16     Temp 04/09/18 1907 99.1 F (37.3 C)     Temp Source 04/09/18 1907 Oral     SpO2 04/09/18 1904 98 %     Weight 04/09/18 1908 220 lb (99.8 kg)     Height 04/09/18 1908 6\' 8"  (2.032 m)     Head Circumference --      Peak Flow --      Pain Score 04/09/18 1907 6     Pain Loc --      Pain Edu? --      Excl. in GC? --     Physical Exam  Constitutional: He appears well-developed and well-nourished.  HENT:  Head: Normocephalic and atraumatic.  Eyes: Conjunctivae are normal.  Neck: Neck supple.  Cardiovascular: Normal rate, regular rhythm, normal heart sounds and intact distal pulses.  No murmur heard. Pulmonary/Chest: Effort normal and breath sounds normal. No stridor. No respiratory  distress. He has no wheezes.  Abdominal: Soft. There is no tenderness.  Musculoskeletal: Normal range of motion. He exhibits tenderness (TTP to left big toe) and deformity (left big toe). He exhibits no edema.  Neurological: He is alert.  Skin: Skin is warm and dry. Capillary refill takes less than 2 seconds.  Psychiatric: He has a normal mood and affect.  Nursing note and vitals reviewed.    ED Treatments / Results  Labs (all labs ordered are listed, but only abnormal results are displayed) Labs Reviewed - No data to display  EKG None  Radiology Dg Ankle Complete Left  Result Date: 04/09/2018 CLINICAL DATA:  Left foot and ankle pain following a basketball injury. EXAM: LEFT ANKLE COMPLETE - 3+ VIEW COMPARISON:  Left foot  radiographs obtained at the same time. FINDINGS: There is no evidence of fracture, dislocation, or joint effusion. There is no evidence of arthropathy or other focal bone abnormality. Soft tissues are unremarkable. IMPRESSION: Normal examination. Electronically Signed   By: Beckie SaltsSteven  Reid M.D.   On: 04/09/2018 19:57   Dg Foot Complete Left  Result Date: 04/09/2018 CLINICAL DATA:  Postreduction views of previous interphalangeal joint dislocation of the first toe. EXAM: LEFT FOOT - COMPLETE 3+ VIEW COMPARISON:  04/09/2018 FINDINGS: Anatomic appearance of the interphalangeal joint of the left first toe postreduction. No acute fractures are demonstrated. Soft tissues are unremarkable. IMPRESSION: Anatomic appearance of the interphalangeal joint of the left first toe postreduction. Electronically Signed   By: Burman NievesWilliam  Stevens M.D.   On: 04/09/2018 21:05   Dg Foot Complete Left  Result Date: 04/09/2018 CLINICAL DATA:  Left foot pain and possible toe dislocation following a basketball injury. EXAM: LEFT FOOT - COMPLETE 3+ VIEW COMPARISON:  Left ankle radiographs obtained at the same time. FINDINGS: Dorsal dislocation of the 1st distal phalanx relative to the proximal phalanx. This is also laterally subluxed and angulated. No visible fracture. IMPRESSION: Dislocation at the 1st IP joint with no visible fracture. Electronically Signed   By: Beckie SaltsSteven  Reid M.D.   On: 04/09/2018 19:56    Procedures .Ortho Injury Treatment Date/Time: 04/09/2018 11:56 PM Performed by: Virgina Norfolkuratolo, Deleon Passe, DO Authorized by: Virgina Norfolkuratolo, Farmer Mccahill, DO   Consent:    Consent obtained:  Verbal   Consent given by:  Patient   Risks discussed:  Fracture, irreducible dislocation, nerve damage, recurrent dislocation, restricted joint movement, stiffness and vascular damage   Alternatives discussed:  No treatmentInjury location: toe Location details: left great toe Injury type: dislocation Dislocation type: IP Pre-procedure neurovascular assessment:  neurovascularly intact Pre-procedure distal perfusion: normal Pre-procedure neurological function: normal Pre-procedure range of motion: reduced Anesthesia: nerve block  Anesthesia: Local anesthesia used: yes Local Anesthetic: lidocaine 1% without epinephrine Anesthetic total: 8 mL  Patient sedated: NoManipulation performed: yes Reduction successful: yes X-ray confirmed reduction: yes Immobilization: tape (hard sole post op boot) Post-procedure neurovascular assessment: post-procedure neurovascularly intact Post-procedure distal perfusion: normal Post-procedure neurological function: normal Post-procedure range of motion: improved Patient tolerance: Patient tolerated the procedure well with no immediate complications    (including critical care time)  Medications Ordered in ED Medications  HYDROcodone-acetaminophen (NORCO/VICODIN) 5-325 MG per tablet 1 tablet (1 tablet Oral Given 04/09/18 2103)  lidocaine (PF) (XYLOCAINE) 1 % injection 5 mL (5 mLs Infiltration Given 04/09/18 2104)     Initial Impression / Assessment and Plan / ED Course  I have reviewed the triage vital signs and the nursing notes.  Pertinent labs & imaging results that were available during my care of  the patient were reviewed by me and considered in my medical decision making (see chart for details).     Brad Mayo is a 27 year old male with no significant medical history who presents to the ED with left toe pain.  Patient with normal vitals.  No fever.  Patient was playing basketball and injured his left foot.  Patient with obvious deformity to the left big toe.  Patient is neurovascularly intact on exam.  Range of motion is decreased secondary to pain and deformity.  Patient was given Norco for pain.  X-rays of the left foot showed dislocation of the left big toe.  Nerve block was performed and successful reduction of the left big toe was performed by myself.  Follow-up x-rays showed improved alignment.  Orthopedics was consulted by the phone and talked with Dr. Magnus IvanBlackman who will follow up with the patient outpatient.  He recommends buddy taping the toes and using postop shoe.  Patient given return precautions and given restriction precautions.  Discharged from the ED in good condition and recommend Tylenol/Motrin for pain.  Final Clinical Impressions(s) / ED Diagnoses   Final diagnoses:  Dislocated toe, left, initial encounter    ED Discharge Orders         Ordered    ibuprofen (ADVIL,MOTRIN) 800 MG tablet  Every 8 hours PRN     04/09/18 2136           Virgina NorfolkCuratolo, Jermone Geister, DO 04/10/18 0000

## 2018-04-09 NOTE — ED Notes (Signed)
Bed: [id number] Expected date:  Expected time:  Means of arrival:  Comments: 27 yo dislocated great toe

## 2018-04-09 NOTE — ED Triage Notes (Signed)
[facility]. Pt reports he was playing basketball when he jumped and landed on his foot wrong. Felt a "pop"

## 2018-04-16 ENCOUNTER — Encounter (INDEPENDENT_AMBULATORY_CARE_PROVIDER_SITE_OTHER): Payer: Self-pay | Admitting: Orthopaedic Surgery

## 2018-04-16 ENCOUNTER — Ambulatory Visit (INDEPENDENT_AMBULATORY_CARE_PROVIDER_SITE_OTHER): Payer: Self-pay | Admitting: Orthopaedic Surgery

## 2018-04-16 DIAGNOSIS — S93105A Unspecified dislocation of left toe(s), initial encounter: Secondary | ICD-10-CM | POA: Insufficient documentation

## 2018-04-16 NOTE — Progress Notes (Signed)
   Office Visit Note   Patient: Brad Mayo           Date of Birth: 03/21/1991           MRN: [id number] Visit Date: 04/16/2018              Requested by: No referring provider defined for this encounter. PCP: Patient, No Pcp Per   Assessment & Plan: Visit Diagnoses:  1. Closed dislocation of toe, left, initial encounter     Plan: Impression is 1 week status post reduction IP joint left great toe following traumatic dislocation.  Patient is doing well.  He will remain in a postoperative shoe weightbearing as tolerated.  We will continue with buddy taping the first and second toes.  Follow-up with us in 2 weeks time for recheck.  Follow-Up Instructions: Return in about 2 weeks (around 04/30/2018).   Orders:  No orders of the defined types were placed in this encounter.  No orders of the defined types were placed in this encounter.     Procedures: No procedures performed   Clinical Data: No additional findings.   Subjective: Chief Complaint  Patient presents with  . Left Foot - Pain    HPI patient is a pleasant 27 year old gentleman who presents to our clinic today for follow-up of his left great toe injury.  On 04/09/2018, he was playing basketball when he jumped up and came down awkwardly on his left foot.  He noticed acute pain and deformity and was seen in the ED.  It was noted on x-ray that he had a dislocation to the first toe IP joint.  This was reduced in the ED.  Postreduction x-rays confirmed this.  He was placed in a postop shoe weightbearing as tolerated and has been buddy taping his first and second toes.  He has minimal pain.  No numbness, tingling or burning.  Review of Systems as detailed in HPI.  All others reviewed and are negative.   Objective: Vital Signs: There were no vitals taken for this visit.  Physical Exam well-developed well-nourished gentleman in no acute distress.  Alert and oriented x3.    Ortho Exam examination of the left great toe  reveals no swelling.  Minimal pain.  He is neurovascularly intact distally.  Specialty Comments:  No specialty comments available.  Imaging: No new imaging   PMFS History: Patient Active Problem List   Diagnosis Date Noted  . Closed dislocation of toe, left, initial encounter 04/16/2018   History reviewed. No pertinent past medical history.  History reviewed. No pertinent family history.  Past Surgical History:  Procedure Laterality Date  . EYE SURGERY     Social History   Occupational History  . Not on file  Tobacco Use  . Smoking status: Never Smoker  . Smokeless tobacco: Never Used  Substance and Sexual Activity  . Alcohol use: No  . Drug use: Yes    Types: Marijuana  . Sexual activity: Not on file

## 2018-04-30 ENCOUNTER — Encounter (INDEPENDENT_AMBULATORY_CARE_PROVIDER_SITE_OTHER): Payer: Self-pay | Admitting: Orthopaedic Surgery

## 2018-04-30 ENCOUNTER — Ambulatory Visit (INDEPENDENT_AMBULATORY_CARE_PROVIDER_SITE_OTHER): Payer: Self-pay | Admitting: Orthopaedic Surgery

## 2018-04-30 DIAGNOSIS — S93105A Unspecified dislocation of left toe(s), initial encounter: Secondary | ICD-10-CM

## 2018-04-30 NOTE — Progress Notes (Signed)
   Office Visit Note   Patient: Brad Mayo           Date of Birth: Jan 23, 1991           MRN: [id number] Visit Date: 04/30/2018              Requested by: No referring provider defined for this encounter. PCP: Patient, No Pcp Per   Assessment & Plan: Visit Diagnoses:  1. Closed dislocation of toe, left, initial encounter     Plan: Discontinue buddy taping.  I urged him to wear closed toe shoes.  Work note and sport no was provided today for 3 weeks.  Follow-up at that time for reevaluation questions encouraged and answered.  Follow-Up Instructions: Return in about 3 weeks (around 05/21/2018).   Orders:  No orders of the defined types were placed in this encounter.  No orders of the defined types were placed in this encounter.     Procedures: No procedures performed   Clinical Data: No additional findings.   Subjective: Chief Complaint  Patient presents with  . Left Foot - Follow-up    Patient follows up 3 weeks status post closed dislocation of his left great toe.  He is doing well and denies any pain.  He has been buddy taping it.  He does have some soreness.   Review of Systems   Objective: Vital Signs: There were no vitals taken for this visit.  Physical Exam  Ortho Exam Left great toe exam shows no significant swelling or any neurovascular compromise.  Clinical alignment is anatomic. Specialty Comments:  No specialty comments available.  Imaging: No results found.   PMFS History: Patient Active Problem List   Diagnosis Date Noted  . Closed dislocation of toe, left, initial encounter 04/16/2018   History reviewed. No pertinent past medical history.  History reviewed. No pertinent family history.  Past Surgical History:  Procedure Laterality Date  . EYE SURGERY     Social History   Occupational History  . Not on file  Tobacco Use  . Smoking status: Never Smoker  . Smokeless tobacco: Never Used  Substance and Sexual Activity  . Alcohol  use: No  . Drug use: Yes    Types: Marijuana  . Sexual activity: Not on file

## 2018-11-15 ENCOUNTER — Other Ambulatory Visit: Payer: Self-pay

## 2018-11-15 ENCOUNTER — Ambulatory Visit (HOSPITAL_COMMUNITY)
Admission: EM | Admit: 2018-11-15 | Discharge: 2018-11-15 | Disposition: A | Discharge status: Home / Self Care | Payer: Self-pay | Attending: Family Medicine | Admitting: Family Medicine

## 2018-11-15 ENCOUNTER — Encounter (HOSPITAL_COMMUNITY): Payer: Self-pay

## 2018-11-15 DIAGNOSIS — N4889 Other specified disorders of penis: Secondary | ICD-10-CM

## 2018-11-15 MED ORDER — BACITRACIN ZINC 500 UNIT/GM EX OINT: 1.0000 "application " | TOPICAL_OINTMENT | Freq: Two times a day (BID) | CUTANEOUS | 0 refills | Status: AC

## 2018-11-15 NOTE — ED Provider Notes (Signed)
[facility]    CSN: [id number] Arrival date & time: 11/15/18  8882     History   Chief Complaint Chief Complaint  Patient presents with  . Skin Problem    HPI Brad Mayo is a 28 y.o. male.   Pt is a 28 year old male that presents with lump on penis.  He first noticed this approximately 2 months ago and at that time was tested for STDs with negative results.  This morning he woke up and there was drainage coming from the lump.  The lump has not been painful.  Denies any redness or increased warmth.  Denies any fevers.  Reports that when he had an erection is when the drainage was coming out of the lump.  He then expressed more drainage of the lump. He did put a warm compress on the area.   ROS per HPI      History reviewed. No pertinent past medical history.  Patient Active Problem List   Diagnosis Date Noted  . Closed dislocation of toe, left, initial encounter 04/16/2018    Past Surgical History:  Procedure Laterality Date  . EYE SURGERY         Home Medications    Prior to Admission medications   Medication Sig Start Date End Date Taking? Authorizing Provider  bacitracin ointment Apply 1 application topically 2 (two) times daily. 11/15/18   Dahlia Byes A, NP  ibuprofen (ADVIL,MOTRIN) 800 MG tablet Take 1 tablet (800 mg total) by mouth every 8 (eight) hours as needed for up to 30 doses. Patient not taking: Reported on 04/30/2018 04/09/18   Virgina Norfolk, DO    Family History History reviewed. No pertinent family history.  Social History Social History   Tobacco Use  . Smoking status: Never Smoker  . Smokeless tobacco: Never Used  Substance Use Topics  . Alcohol use: No  . Drug use: Yes    Types: Marijuana     Allergies   Patient has no known allergies.   Review of Systems Review of Systems   Physical Exam Triage Vital Signs ED Triage Vitals  Enc Vitals Group     BP 11/15/18 1009 112/64     Pulse Rate 11/15/18 1009 90   Resp 11/15/18 1009 18     Temp 11/15/18 1009 98.2 F (36.8 C)     Temp Source 11/15/18 1009 Oral     SpO2 11/15/18 1009 100 %     Weight 11/15/18 1007 220 lb (99.8 kg)     Height 11/15/18 1007 6\' 8"  (2.032 m)     Head Circumference --      Peak Flow --      Pain Score 11/15/18 1007 0     Pain Loc --      Pain Edu? --      Excl. in GC? --    No data found.  Updated Vital Signs BP 112/64 (BP Location: Right Arm)   Pulse 90   Temp 98.2 F (36.8 C) (Oral)   Resp 18   Ht 6\' 8"  (2.032 m)   Wt 220 lb (99.8 kg)   SpO2 100%   BMI 24.17 kg/m   Visual Acuity Right Eye Distance:   Left Eye Distance:   Bilateral Distance:    Right Eye Near:   Left Eye Near:    Bilateral Near:     Physical Exam Vitals signs and nursing note reviewed. Exam conducted with a chaperone present.  Constitutional:  Appearance: Normal appearance.  HENT:     Head: Normocephalic and atraumatic.     Nose: Nose normal.     Mouth/Throat:     Pharynx: Oropharynx is clear.  Eyes:     Conjunctiva/sclera: Conjunctivae normal.  Neck:     Musculoskeletal: Normal range of motion.  Pulmonary:     Effort: Pulmonary effort is normal.  Genitourinary:    Comments: Penis with small movable cyst.  Located at approximate 3 o'clock on the glans and shaft Expressed cheesy white substance from cyst Non tender to touch  No erythema Musculoskeletal: Normal range of motion.  Skin:    General: Skin is warm and dry.  Neurological:     Mental Status: He is alert.  Psychiatric:        Mood and Affect: Mood normal.      UC Treatments / Results  Labs (all labs ordered are listed, but only abnormal results are displayed) Labs Reviewed - No data to display  EKG None  Radiology No results found.  Procedures Procedures (including critical care time)  Medications Ordered in UC Medications - No data to display  Initial Impression / Assessment and Plan / UC Course  I have reviewed the triage vital signs  and the nursing notes.  Pertinent labs & imaging results that were available during my care of the patient were reviewed by me and considered in my medical decision making (see chart for details).     Sebaceous cyst-expressed some cheesy substance from cyst. Pt tolerated well and not having any pain. Does not look infected.  Recommend he  do warm compresses and massage cyst to express more drainage. He can apply small amount of bacitracin to prevent infection.  Warning signs given for infection.  Follow up as needed for continued or worsening symptoms   Final Clinical Impressions(s) / UC Diagnoses   Final diagnoses:  Sebaceous cyst of penis     Discharge Instructions     This is a cyst You can keep doing the warm rags and massaging to get the cyst fluid out Maybe use some bacitracin on the open area to prevent infection If the area becomes very painful, red, more swollen please return for check up.     ED Prescriptions    Medication Sig Dispense Auth. Provider   bacitracin ointment Apply 1 application topically 2 (two) times daily. 120 g Dahlia Byes A, NP     Controlled Substance Prescriptions [place] Controlled Substance Registry consulted? Not Applicable   Janace Aris, NP 11/15/18 1218

## 2018-11-15 NOTE — Discharge Instructions (Signed)
This is a cyst You can keep doing the warm rags and massaging to get the cyst fluid out Maybe use some bacitracin on the open area to prevent infection If the area becomes very painful, red, more swollen please return for check up.

## 2018-11-15 NOTE — ED Triage Notes (Addendum)
Patient presents to Urgent Care with complaints of a "lump" on the side of his penis since about 2 months ago. Patient states he has not been worried about it until this morning when he "woke up hard and noticed some white stuff coming out". Patient denies urinary symptoms.

## 2019-04-29 ENCOUNTER — Emergency Department (HOSPITAL_COMMUNITY)
Admission: EM | Admit: 2019-04-29 | Discharge: 2019-04-29 | Disposition: A | Discharge status: Home / Self Care | Payer: No Typology Code available for payment source | Attending: Emergency Medicine | Admitting: Emergency Medicine

## 2019-04-29 ENCOUNTER — Encounter (HOSPITAL_COMMUNITY): Payer: Self-pay

## 2019-04-29 ENCOUNTER — Other Ambulatory Visit: Payer: Self-pay

## 2019-04-29 DIAGNOSIS — M542 Cervicalgia: Secondary | ICD-10-CM | POA: Diagnosis present

## 2019-04-29 DIAGNOSIS — M7918 Myalgia, other site: Secondary | ICD-10-CM | POA: Diagnosis not present

## 2019-04-29 DIAGNOSIS — Y998 Other external cause status: Secondary | ICD-10-CM | POA: Diagnosis not present

## 2019-04-29 DIAGNOSIS — Y9389 Activity, other specified: Secondary | ICD-10-CM | POA: Diagnosis not present

## 2019-04-29 DIAGNOSIS — Y92410 Unspecified street and highway as the place of occurrence of the external cause: Secondary | ICD-10-CM | POA: Diagnosis not present

## 2019-04-29 DIAGNOSIS — F1210 Cannabis abuse, uncomplicated: Secondary | ICD-10-CM | POA: Insufficient documentation

## 2019-04-29 DIAGNOSIS — M25511 Pain in right shoulder: Secondary | ICD-10-CM | POA: Diagnosis not present

## 2019-04-29 MED ORDER — NAPROXEN 500 MG PO TABS: 500.0000 mg | ORAL_TABLET | Freq: Two times a day (BID) | ORAL | 0 refills | Status: AC

## 2019-04-29 MED ORDER — LIDOCAINE 5 % EX PTCH: 1.0000 | MEDICATED_PATCH | CUTANEOUS | Status: DC

## 2019-04-29 MED ORDER — LIDOCAINE 5 % EX PTCH: 1.0000 | MEDICATED_PATCH | CUTANEOUS | 0 refills | Status: AC

## 2019-04-29 MED ORDER — METHOCARBAMOL 500 MG PO TABS: 500.0000 mg | ORAL_TABLET | Freq: Two times a day (BID) | ORAL | 0 refills | Status: AC
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
## 2019-04-29 NOTE — ED Notes (Signed)
Pt dc'd home with all belongs, a/o x4, ambulatory on dc
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
## 2019-04-29 NOTE — ED Provider Notes (Signed)
  Note created in error.  Unable to delete.  Please see my other dictation.   Deliah Boston, PA-C 04/30/19 1438    Pattricia Boss, MD 04/30/19 [phone]

## 2019-05-10 ENCOUNTER — Other Ambulatory Visit: Payer: Self-pay

## 2019-05-10 ENCOUNTER — Emergency Department (HOSPITAL_COMMUNITY)
Admission: EM | Admit: 2019-05-10 | Discharge: 2019-05-10 | Discharge status: Against Medical Advice | Payer: No Typology Code available for payment source | Attending: Emergency Medicine | Admitting: Emergency Medicine

## 2019-05-10 ENCOUNTER — Encounter (HOSPITAL_COMMUNITY): Payer: Self-pay | Admitting: Emergency Medicine

## 2019-05-10 DIAGNOSIS — Z5321 Procedure and treatment not carried out due to patient leaving prior to being seen by health care provider: Secondary | ICD-10-CM | POA: Diagnosis not present

## 2019-05-10 DIAGNOSIS — M549 Dorsalgia, unspecified: Secondary | ICD-10-CM | POA: Diagnosis present
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
## 2019-05-14 ENCOUNTER — Emergency Department (HOSPITAL_COMMUNITY): Payer: No Typology Code available for payment source

## 2019-05-14 ENCOUNTER — Encounter (HOSPITAL_COMMUNITY): Payer: Self-pay | Admitting: *Deleted

## 2019-05-14 ENCOUNTER — Emergency Department (HOSPITAL_COMMUNITY)
Admission: EM | Admit: 2019-05-14 | Discharge: 2019-05-14 | Disposition: A | Discharge status: Home / Self Care | Payer: No Typology Code available for payment source | Attending: Emergency Medicine | Admitting: Emergency Medicine

## 2019-05-14 ENCOUNTER — Other Ambulatory Visit: Payer: Self-pay

## 2019-05-14 DIAGNOSIS — M545 Low back pain: Secondary | ICD-10-CM | POA: Insufficient documentation

## 2019-05-14 DIAGNOSIS — M25561 Pain in right knee: Secondary | ICD-10-CM | POA: Insufficient documentation

## 2019-05-14 DIAGNOSIS — M542 Cervicalgia: Secondary | ICD-10-CM | POA: Insufficient documentation

## 2019-05-14 MED ORDER — METHOCARBAMOL 500 MG PO TABS: 500.0000 mg | ORAL_TABLET | Freq: Three times a day (TID) | ORAL | 0 refills | Status: AC | PRN

## 2019-05-14 MED ORDER — NAPROXEN 500 MG PO TABS: 500.0000 mg | ORAL_TABLET | Freq: Two times a day (BID) | ORAL | 0 refills | Status: AC
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
## 2019-05-14 NOTE — ED Notes (Signed)
Patient transported to X-ray 

## 2019-05-14 NOTE — ED Notes (Signed)
In xray
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
## 2019-05-14 NOTE — Discharge Instructions (Addendum)
You were seen in the emergency department today for neck/back/knee pain.  Your imaging was reassuring.  There were no significant fractures or dislocations in your spine or knee.  We are sending you home with the following:  - Naproxen is a nonsteroidal anti-inflammatory medication that will help with pain and swelling. Be sure to take this medication as prescribed with food, 1 pill every 12 hours,  It should be taken with food, as it can cause stomach upset, and more seriously, stomach bleeding. Do not take other nonsteroidal anti-inflammatory medications with this such as Advil, Motrin, Aleve, Mobic, Goodie Powder, or Motrin.    - Robaxin is the muscle relaxer I have prescribed, this is meant to help with muscle tightness. Be aware that this medication may make you drowsy therefore the first time you take this it should be at a time you are in an environment where you can rest. Do not drive or operate heavy machinery when taking this medication. Do not drink alcohol or take other sedating medications with this medicine such as narcotics or benzodiazepines.   You make take Tylenol per over the counter dosing with these medications.   We have prescribed you new medication(s) today. Discuss the medications prescribed today with your pharmacist as they can have adverse effects and interactions with your other medicines including over the counter and prescribed medications. Seek medical evaluation if you start to experience new or abnormal symptoms after taking one of these medicines, seek care immediately if you start to experience difficulty breathing, feeling of your throat closing, facial swelling, or rash as these could be indications of a more serious allergic reaction  Call the orthopedic office in your discharge instructions for follow up within 1 week Return to the ER for new or worsening symptoms or any other concerns.

## 2019-05-19 ENCOUNTER — Encounter: Payer: Self-pay | Admitting: Physician Assistant

## 2019-05-19 ENCOUNTER — Ambulatory Visit (INDEPENDENT_AMBULATORY_CARE_PROVIDER_SITE_OTHER): Payer: Self-pay | Admitting: Physician Assistant

## 2019-05-19 DIAGNOSIS — S161XXA Strain of muscle, fascia and tendon at neck level, initial encounter: Secondary | ICD-10-CM

## 2019-05-19 DIAGNOSIS — M25561 Pain in right knee: Secondary | ICD-10-CM

## 2019-05-19 MED ORDER — METHYLPREDNISOLONE 4 MG PO TABS: ORAL_TABLET | ORAL | 0 refills | Status: AC
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
## 2019-06-02 ENCOUNTER — Other Ambulatory Visit: Payer: Self-pay

## 2019-06-02 ENCOUNTER — Encounter: Payer: Self-pay | Admitting: Physical Therapy

## 2019-06-02 ENCOUNTER — Ambulatory Visit: Payer: Self-pay | Attending: Physician Assistant | Admitting: Physical Therapy

## 2019-06-02 DIAGNOSIS — M25561 Pain in right knee: Secondary | ICD-10-CM | POA: Insufficient documentation

## 2019-06-02 DIAGNOSIS — M542 Cervicalgia: Secondary | ICD-10-CM | POA: Insufficient documentation
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
## 2019-06-14 ENCOUNTER — Ambulatory Visit: Payer: Self-pay | Admitting: Physical Therapy

## 2019-06-16 ENCOUNTER — Ambulatory Visit: Payer: Self-pay | Admitting: Physical Therapy

## 2019-06-16 ENCOUNTER — Telehealth: Payer: Self-pay | Admitting: Physical Therapy

## 2019-06-16 NOTE — Telephone Encounter (Signed)
Spoke to patient regarding no show to appointment. He is out of town. His flight was delayed. He plans to attend his next appointment.

## 2019-06-21 ENCOUNTER — Ambulatory Visit: Payer: Self-pay | Admitting: Physical Therapy

## 2019-06-21 ENCOUNTER — Other Ambulatory Visit: Payer: Self-pay

## 2019-06-21 ENCOUNTER — Encounter: Payer: Self-pay | Admitting: Physical Therapy

## 2019-06-21 DIAGNOSIS — M542 Cervicalgia: Secondary | ICD-10-CM

## 2019-06-21 DIAGNOSIS — M25561 Pain in right knee: Secondary | ICD-10-CM

## 2019-06-21 NOTE — Therapy (Signed)
[place], [place], [address] Phone: [phone]   Fax:  [phone]  Physical Therapy Treatment  Patient Details  Name: Brad Mayo MRN: [id number] Date of Birth: 1991/04/02 Referring Provider (PT): Erskine Emery, PA-C   Encounter Date: 06/21/2019  PT End of Session - 06/21/19 1151    Visit Number  2    Number of Visits  12    Date for PT Re-Evaluation  07/14/19    Authorization Type  self-pay    PT Start Time  1147    PT Stop Time  1226    PT Time Calculation (min)  39 min       History reviewed. No pertinent past medical history.  Past Surgical History:  Procedure Laterality Date  . EYE SURGERY      There were no vitals filed for this visit.  Subjective Assessment - 06/21/19 1211    Subjective  Knee is getting better but the neck hurts all the time. I do the bike in my apartment building.    Currently in Pain?  Yes    Pain Score  7     Pain Location  Neck    Pain Orientation  Posterior    Pain Descriptors / Indicators  Aching    Aggravating Factors   constant    Pain Relieving Factors  meds    Pain Score  6    Pain Location  Knee    Pain Orientation  Right;Medial    Pain Descriptors / Indicators  Sore    Aggravating Factors   stretching, bending    Pain Relieving Factors  meds rest                       [facility] Adult PT Treatment/Exercise - 06/21/19 0001      Neck Exercises: Standing   Other Standing Exercises  Rows and extension green band standing       Neck Exercises: Seated   Neck Retraction  10 reps    Postural Training  scap squeezes x 10        Neck Exercises: Supine   Neck Retraction  10 reps      Knee/Hip Exercises: Stretches   Active Hamstring Stretch  3 reps;30 seconds    ITB Stretch  3 reps;30 seconds    Other Knee/Hip Stretches  adductor stretch 3 x 30 sec  with strap       Knee/Hip Exercises: Aerobic   Recumbent Bike  L2 x 5 minutes       Knee/Hip Exercises: Supine   Quad Sets  10 reps    Heel Slides  10 reps    Straight Leg Raises  10 reps      Modalities   Modalities  Iontophoresis      Iontophoresis   Type of Iontophoresis  Dexamethasone    Location  medial , distal knee    Dose  61ml    Time  6 hour patch        Manual Therapy   Manual Therapy  Soft tissue mobilization    Soft tissue mobilization  upper trap, thoracid/ cervical paraspinals, levator scap, trigger point release levator bilateral       Neck Exercises: Stretches   Other Neck Stretches  passive UT and LEvator stretches x 2 each                   PT Long Term Goals - 06/02/19 1133  PT LONG TERM GOAL #1   Title  Independent with HEP    Baseline  needs HEP    Time  6    Period  Weeks    Status  New    Target Date  07/14/19      PT LONG TERM GOAL #2   Title  Improve FOTO outcome measure score to 32% or less impairment    Baseline  53% limited    Time  6    Period  Weeks    Status  New    Target Date  07/14/19      PT LONG TERM GOAL #3   Title  Increase right knee extension strength to 5/5 to improve ability for stair navigation into apartment and for squatting, lifting activities    Baseline  4/5    Time  6    Period  Weeks    Status  New    Target Date  07/14/19      PT LONG TERM GOAL #4   Title  Increase cervical rotation AROM bilat. at least 5-10 deg to improve ability to turn head while driving    Baseline  left 55 deg, right 60 deg    Time  6    Period  Weeks    Status  New    Target Date  07/14/19      PT LONG TERM GOAL #5   Title  Perform community level ambulation and ADLs/IADLs as needed with knee and neck pain 3/10 or less at worst    Baseline  7/10 neck pain, 8/10 knee pain    Time  6    Period  Weeks    Status  New    Target Date  07/14/19            Plan - 06/21/19 1234    Clinical Impression Statement  Pt arrives with less knee pain and continued neck pain. He reports compliance with HEP.  Progressed scapular and neck stabilization and reviewed HEP. Soft tissue work performed to posterior neck and upper trap, TPR release to levator helpful. He reported decreased neck pain post session. Continued quad activation and hip knee stretcing. Trial of ionto patch to pes anserine today. No increased pain.    PT Next Visit Plan  assess ionto and continue if helpful, assess response to manual; pt is interested in [facility]. Knee-consider [place] medial knee/distal adductor and pes anserine region, possible ionto this region when certification signed, STM medial quad and distal adductor region, bike for ROM pending pain and gentle strengthening as tolerated. For neck STM cervical paraspinals, stretches for upper trap region and neck extensors, ROM/stabilization and modalities prn    PT Home Exercise Plan  Knee: heel slides, hamstring and adductor stretch with strap, quad sets; Neck: cervical and scapular retractions, neck extensor stretch, cervical ROM/stretches for sidebending and rotation       Patient will benefit from skilled therapeutic intervention in order to improve the following deficits and impairments:  Pain, Impaired flexibility, Increased fascial restricitons, Decreased strength, Decreased range of motion, Increased edema, Increased muscle spasms, Decreased activity tolerance  Visit Diagnosis: Cervicalgia  Acute pain of right knee     Problem List Patient Active Problem List   Diagnosis Date Noted  . Closed dislocation of toe, left, initial encounter 04/16/2018    Sherrie Mustache, PTA 06/21/2019, 12:36 PM  [facility] [address] [place], [place], [address] Phone: [phone]   Fax:  [phone]  Name: Brad Mayo MRN: [id number] Date of Birth: 10/06/1990

## 2019-06-23 ENCOUNTER — Ambulatory Visit: Payer: Self-pay | Admitting: Physical Therapy

## 2019-06-24 ENCOUNTER — Other Ambulatory Visit: Payer: Self-pay

## 2019-06-24 ENCOUNTER — Ambulatory Visit: Payer: Medicaid Other | Admitting: Physical Therapy

## 2019-06-24 ENCOUNTER — Encounter: Payer: Self-pay | Admitting: Physical Therapy

## 2019-06-24 DIAGNOSIS — M542 Cervicalgia: Secondary | ICD-10-CM

## 2019-06-24 DIAGNOSIS — M25561 Pain in right knee: Secondary | ICD-10-CM

## 2019-06-24 NOTE — Therapy (Signed)
[facility] Outpatient Rehabilitation [facility] [address] [place], [place], [address] Phone: [phone]   Fax:  [phone]  Physical Therapy Treatment  Patient Details  Name: Brad Mayo MRN: [id number] Date of Birth: Feb 04, 1991 Referring Provider (PT): Richardean Canal, PA-C   Encounter Date: 06/24/2019  PT End of Session - 06/24/19 6269    Visit Number  3    Number of Visits  12    Date for PT Re-Evaluation  07/14/19    Authorization Type  self-pay    PT Start Time  0735   20 minutes late   PT Stop Time  0800    PT Time Calculation (min)  25 min       History reviewed. No pertinent past medical history.  Past Surgical History:  Procedure Laterality Date  . EYE SURGERY      There were no vitals filed for this visit.  Subjective Assessment - 06/24/19 0737    Subjective  Neck feels better than last time,    Currently in Pain?  Yes    Pain Score  6     Pain Location  Neck    Pain Orientation  Posterior    Pain Descriptors / Indicators  Aching    Pain Type  Acute pain    Pain Score  6    Pain Location  Knee    Pain Orientation  Right;Medial    Pain Descriptors / Indicators  Sore                       [facility] Adult PT Treatment/Exercise - 06/24/19 0001      Neck Exercises: Standing   Other Standing Exercises  Rows and extension green band standing  x 20 each       Neck Exercises: Seated   Neck Retraction  --    Postural Training  scap squeezes x 10        Neck Exercises: Supine   Neck Retraction  10 reps      Knee/Hip Exercises: Stretches   Active Hamstring Stretch  3 reps;30 seconds    Quad Stretch  3 reps   prone with strap   Quad Stretch Limitations  and standing x 1 for HEP    Gastroc Stretch Limitations  standing and long sitting    Other Knee/Hip Stretches  adductor stretch 3 x 30 sec  with strap     Other Knee/Hip Stretches  butterfly stretch sitting       Knee/Hip Exercises: Aerobic   Recumbent Bike  L 3x 5  minutes       Knee/Hip Exercises: Supine   Quad Sets  10 reps    Straight Leg Raises  15 reps      Knee/Hip Exercises: Sidelying   Hip ABduction  20 reps    Hip ABduction Limitations  right    Clams  x 20 right      Neck Exercises: Stretches   Upper Trapezius Stretch  3 reps    Levator Stretch  3 reps             PT Education - 06/24/19 0817    Education Details  HEP    Person(s) Educated  Patient    Methods  Explanation;Handout    Comprehension  Verbalized understanding          PT Long Term Goals - 06/02/19 1133      PT LONG TERM GOAL #1   Title  Independent with HEP  Baseline  needs HEP    Time  6    Period  Weeks    Status  New    Target Date  07/14/19      PT LONG TERM GOAL #2   Title  Improve FOTO outcome measure score to 32% or less impairment    Baseline  53% limited    Time  6    Period  Weeks    Status  New    Target Date  07/14/19      PT LONG TERM GOAL #3   Title  Increase right knee extension strength to 5/5 to improve ability for stair navigation into apartment and for squatting, lifting activities    Baseline  4/5    Time  6    Period  Weeks    Status  New    Target Date  07/14/19      PT LONG TERM GOAL #4   Title  Increase cervical rotation AROM bilat. at least 5-10 deg to improve ability to turn head while driving    Baseline  left 55 deg, right 60 deg    Time  6    Period  Weeks    Status  New    Target Date  07/14/19      PT LONG TERM GOAL #5   Title  Perform community level ambulation and ADLs/IADLs as needed with knee and neck pain 3/10 or less at worst    Baseline  7/10 neck pain, 8/10 knee pain    Time  6    Period  Weeks    Status  New    Target Date  07/14/19            Plan - 06/24/19 0739    Clinical Impression Statement  Pt reports less neck pain after last session. Still rates it as 6/10 as well as knee at 6/10 . He did not notice improvement with iontopatch to medial knee. Patient was 20 minutes late  today therefore time limited for session. Focused updating HEP with LE stretches. No c/o pain during session.    PT Next Visit Plan  assess ionto and continue if helpful, assess response to manual; pt is interested in [facility]. Knee-consider [place] medial knee/distal adductor and pes anserine region, possible ionto this region when certification signed, STM medial quad and distal adductor region, bike for ROM pending pain and gentle strengthening as tolerated. For neck STM cervical paraspinals, stretches for upper trap region and neck extensors, ROM/stabilization and modalities prn    PT Home Exercise Plan  Knee: heel slides, hamstring and adductor stretch with strap, quad sets added quad and calf stretches side hip abduction and clam. ; Neck: cervical and scapular retractions, neck extensor stretch, cervical ROM/stretches for sidebending and rotation       Patient will benefit from skilled therapeutic intervention in order to improve the following deficits and impairments:  Pain, Impaired flexibility, Increased fascial restricitons, Decreased strength, Decreased range of motion, Increased edema, Increased muscle spasms, Decreased activity tolerance  Visit Diagnosis: Cervicalgia  Acute pain of right knee     Problem List Patient Active Problem List   Diagnosis Date Noted  . Closed dislocation of toe, left, initial encounter 04/16/2018    Dorene Ar, PTA 06/24/2019, 8:23 AM  [place], [place], [address] Phone: [phone]   Fax:  [phone]  Name: Brad Mayo MRN: [id number] Date of Birth: 03-30-1991

## 2019-06-28 ENCOUNTER — Encounter: Payer: Self-pay | Admitting: Physical Therapy

## 2019-06-28 ENCOUNTER — Ambulatory Visit: Payer: Self-pay | Attending: Physician Assistant | Admitting: Physical Therapy

## 2019-06-28 ENCOUNTER — Other Ambulatory Visit: Payer: Self-pay

## 2019-06-28 DIAGNOSIS — M542 Cervicalgia: Secondary | ICD-10-CM | POA: Insufficient documentation

## 2019-06-28 DIAGNOSIS — M25561 Pain in right knee: Secondary | ICD-10-CM | POA: Insufficient documentation

## 2019-06-28 NOTE — Therapy (Signed)
[facility] Outpatient Rehabilitation [facility] [address] [place], [place], [address] Phone: [phone]   Fax:  [phone]  Physical Therapy Treatment  Patient Details  Name: Brad Mayo MRN: [id number] Date of Birth: Apr 01, 1991 Referring Provider (PT): Richardean Canal, PA-C   Encounter Date: 06/28/2019  PT End of Session - 06/28/19 1152    Visit Number  4    Number of Visits  12    Date for PT Re-Evaluation  07/14/19    Authorization Type  self-pay    PT Start Time  1145    PT Stop Time  1227    PT Time Calculation (min)  42 min       History reviewed. No pertinent past medical history.  Past Surgical History:  Procedure Laterality Date  . EYE SURGERY      There were no vitals filed for this visit.  Subjective Assessment - 06/28/19 1148    Subjective  Neck is getting better. Knee pain is still come and go. I am doing the exercises.    Currently in Pain?  No/denies    Pain Score  --   7/10 with looking up   Pain Location  Neck    Pain Orientation  Posterior    Pain Descriptors / Indicators  Stabbing    Pain Type  Acute pain    Aggravating Factors   looking up    Pain Score  0    Pain Location  Knee   up to 8/10   Pain Orientation  Right;Medial    Pain Descriptors / Indicators  Sore    Pain Type  Acute pain    Aggravating Factors   prolonged sitting, sometimes with walking    Pain Relieving Factors  rest         [facility] PT Assessment - 06/28/19 0001      AROM   Right Knee Extension  0    Right Knee Flexion  132    Left Knee Extension  0    Left Knee Flexion  143    Cervical Flexion  50    Cervical Extension  30   pain   Cervical - Right Side Bend  40    Cervical - Left Side Bend  40    Cervical - Right Rotation  65    Cervical - Left Rotation  70                   [facility] Adult PT Treatment/Exercise - 06/28/19 0001      Neck Exercises: Seated   Other Seated Exercise  neck snag for cervical extension       Knee/Hip  Exercises: Stretches   Active Hamstring Stretch  3 reps;30 seconds    Quad Stretch  3 reps    Quad Stretch Limitations  sidelying    Other Knee/Hip Stretches  adductor stretch 3 x 30 sec  with strap     Other Knee/Hip Stretches  --      Knee/Hip Exercises: Aerobic   Recumbent Bike  L 3x 5 minutes       Knee/Hip Exercises: Supine   Quad Sets  15 reps    Single Leg Bridge  Right;10 reps    Straight Leg Raises  20 reps      Knee/Hip Exercises: Sidelying   Hip ABduction  20 reps    Hip ABduction Limitations  right    Clams  x 20 right      Modalities   Modalities  Ultrasound      Ultrasound   Ultrasound Location  Right medial knee at pes anserine     Ultrasound Parameters  100% 1.0 w/cm2  1 mhz x 8 min    Ultrasound Goals  Pain      Iontophoresis   Type of Iontophoresis  Dexamethasone    Location  medial , distal knee    Dose  1ml    Time  6 hour patch                    PT Long Term Goals - 06/02/19 1133      PT LONG TERM GOAL #1   Title  Independent with HEP    Baseline  needs HEP    Time  6    Period  Weeks    Status  New    Target Date  07/14/19      PT LONG TERM GOAL #2   Title  Improve FOTO outcome measure score to 32% or less impairment    Baseline  53% limited    Time  6    Period  Weeks    Status  New    Target Date  07/14/19      PT LONG TERM GOAL #3   Title  Increase right knee extension strength to 5/5 to improve ability for stair navigation into apartment and for squatting, lifting activities    Baseline  4/5    Time  6    Period  Weeks    Status  New    Target Date  07/14/19      PT LONG TERM GOAL #4   Title  Increase cervical rotation AROM bilat. at least 5-10 deg to improve ability to turn head while driving    Baseline  left 55 deg, right 60 deg    Time  6    Period  Weeks    Status  New    Target Date  07/14/19      PT LONG TERM GOAL #5   Title  Perform community level ambulation and ADLs/IADLs as needed with knee and  neck pain 3/10 or less at worst    Baseline  7/10 neck pain, 8/10 knee pain    Time  6    Period  Weeks    Status  New    Target Date  07/14/19            Plan - 06/28/19 1230    Clinical Impression Statement  Pt arrives reporting decrease resting pain except with prolonged sitting and intermitently with transitions and walking, he will have knee pain. His neck pain is reproduced with cervical extension. Continued with HEP review and trial of US to medial knee. Pt also requested to try ionto patch again. No increased pain with session. His cervical and knee AROM has improved.    PT Next Visit Plan  assess us/ionto and continue if helpful, assess response to manual; [facility] Knee-consider [place] medial knee/distal adductor and pes anserine region, possible ionto this region when certification signed, STM medial quad and distal adductor region, bike for ROM pending pain and gentle strengthening as tolerated. For neck STM cervical paraspinals, stretches for upper trap region and neck extensors, ROM/stabilization and modalities prn    PT Home Exercise Plan  Knee: heel slides, hamstring and adductor stretch with strap, quad sets added quad and calf stretches side hip abduction and clam. ; Neck: cervical and scapular retractions, neck  extensor stretch, cervical ROM/stretches for sidebending and rotation       Patient will benefit from skilled therapeutic intervention in order to improve the following deficits and impairments:  Pain, Impaired flexibility, Increased fascial restricitons, Decreased strength, Decreased range of motion, Increased edema, Increased muscle spasms, Decreased activity tolerance  Visit Diagnosis: Cervicalgia  Acute pain of right knee     Problem List Patient Active Problem List   Diagnosis Date Noted  . Closed dislocation of toe, left, initial encounter 04/16/2018    Dorene Ar, PTA 06/28/2019, 12:33 PM  [place] [facility] [address] [place], [place], [address] Phone: [phone]   Fax:  [phone]  Name: Brad Mayo MRN: [id number] Date of Birth: 1991-07-08

## 2019-06-30 ENCOUNTER — Other Ambulatory Visit: Payer: Self-pay

## 2019-06-30 ENCOUNTER — Encounter: Payer: Self-pay | Admitting: Physical Therapy

## 2019-06-30 ENCOUNTER — Ambulatory Visit: Payer: Self-pay | Admitting: Physical Therapy

## 2019-06-30 DIAGNOSIS — M542 Cervicalgia: Secondary | ICD-10-CM

## 2019-06-30 DIAGNOSIS — M25561 Pain in right knee: Secondary | ICD-10-CM

## 2019-06-30 NOTE — Therapy (Signed)
[facility] Outpatient Rehabilitation [facility] [address] [place], [place], [address] Phone: [phone]   Fax:  [phone]  Physical Therapy Treatment  Patient Details  Name: Brad Mayo MRN: [id number] Date of Birth: August 14, 1991 Referring Provider (PT): Richardean Canal, PA-C   Encounter Date: 06/30/2019  PT End of Session - 06/30/19 1235    Visit Number  5    Number of Visits  12    Date for PT Re-Evaluation  07/14/19    Authorization Type  self-pay    PT Start Time  1145    PT Stop Time  1224    PT Time Calculation (min)  39 min    Activity Tolerance  Patient tolerated treatment well    Behavior During Therapy  [facility] for tasks assessed/performed       History reviewed. No pertinent past medical history.  Past Surgical History:  Procedure Laterality Date  . EYE SURGERY      There were no vitals filed for this visit.  Subjective Assessment - 06/30/19 1147    Subjective  Pt. reports neck is about the same as last time. Today his knee is his primary concern with increased pain this AM. Mild relief with ionto trial last session. Pain still located primarily in distal medial knee region. Pt. had previously expressed interest in dry needling but reports does not feel ready to try.    Pertinent History  No other significant PMH    Limitations  Sitting;House hold activities;Lifting;Standing;Walking    Diagnostic tests  Cervical-CT scan, right knee-X-rays    Currently in Pain?  Yes    Pain Score  8     Pain Location  Knee    Pain Orientation  Right;Lower;Medial    Pain Descriptors / Indicators  Sharp    Pain Type  Acute pain    Pain Onset  More than a month ago    Pain Frequency  Intermittent    Aggravating Factors   prolonged sitting or lying down    Pain Relieving Factors  stretching    Effect of Pain on Daily Activities  limits positional tolerance                       [facility] Adult PT Treatment/Exercise - 06/30/19 0001      Exercises    Exercises  Knee/Hip      Knee/Hip Exercises: Stretches   Passive Hamstring Stretch  Right;3 reps;30 seconds    Quad Stretch  Right;3 reps;30 seconds    Quad Stretch Limitations  prone    Other Knee/Hip Stretches  supine manual right adductpr stretch 3 x 30 sec      Knee/Hip Exercises: Aerobic   Recumbent Bike  L 3x 5 minutes       Knee/Hip Exercises: Supine   Quad Sets  AROM;Right;10 reps    Straight Leg Raises  20 reps      Knee/Hip Exercises: Sidelying   Hip ABduction  20 reps    Hip ABduction Limitations  right      Knee/Hip Exercises: Prone   Hip Extension  AROM;Strengthening;Right;2 sets;10 reps      Modalities   Modalities  Ultrasound      Ultrasound   Ultrasound Location  Right medial knee-pes anserine region    Ultrasound Parameters  50% 3.0 MHZ 1.0 W/cm2    Ultrasound Goals  Pain      Iontophoresis   Type of Iontophoresis  Dexamethasone    Location  medial ,  distal knee    Dose  30ml    Time  6 hour patch        Manual Therapy   Soft tissue mobilization  roller to right adductors, medial hamstring and quadricep             PT Education - 06/30/19 1234    Education Details  HEP, potential symptom etiology    Person(s) Educated  Patient    Methods  Explanation    Comprehension  Verbalized understanding          PT Long Term Goals - 06/02/19 1133      PT LONG TERM GOAL #1   Title  Independent with HEP    Baseline  needs HEP    Time  6    Period  Weeks    Status  New    Target Date  07/14/19      PT LONG TERM GOAL #2   Title  Improve FOTO outcome measure score to 32% or less impairment    Baseline  53% limited    Time  6    Period  Weeks    Status  New    Target Date  07/14/19      PT LONG TERM GOAL #3   Title  Increase right knee extension strength to 5/5 to improve ability for stair navigation into apartment and for squatting, lifting activities    Baseline  4/5    Time  6    Period  Weeks    Status  New    Target Date   07/14/19      PT LONG TERM GOAL #4   Title  Increase cervical rotation AROM bilat. at least 5-10 deg to improve ability to turn head while driving    Baseline  left 55 deg, right 60 deg    Time  6    Period  Weeks    Status  New    Target Date  07/14/19      PT LONG TERM GOAL #5   Title  Perform community level ambulation and ADLs/IADLs as needed with knee and neck pain 3/10 or less at worst    Baseline  7/10 neck pain, 8/10 knee pain    Time  6    Period  Weeks    Status  New    Target Date  07/14/19            Plan - 06/30/19 1235    Clinical Impression Statement  Treatemt focus on knee pain given this is currently causing the greatest pain and associated limitations. Based on symptom location continue to suspect that this could be associated with pes anserine bursitis thus continued modalities, stretches and gentle manual work to adductor, hamstrings and quad to address. Plan further exercise progress pending improvement with pain.    Personal Factors and Comorbidities  Other;Time since onset of injury/illness/exacerbation    Examination-Activity Limitations  Sit;Locomotion Level;Squat;Lift;Sleep;Stand    Examination-Participation Restrictions  Community Activity;Shop    Stability/Clinical Decision Making  Stable/Uncomplicated    Clinical Decision Making  Low    Rehab Potential  Good    PT Frequency  2x / week    PT Duration  6 weeks    PT Treatment/Interventions  ADLs/Self Care Home Management;Electrical Stimulation;Iontophoresis 4mg /ml Dexamethasone;Moist Heat;Cryotherapy;Ultrasound;Traction;Therapeutic exercise;Therapeutic activities;Neuromuscular re-education;Manual techniques;Dry needling;Passive range of motion;Vasopneumatic Device;Taping;Spinal Manipulations    PT Next Visit Plan  Continue knee focus with [place], ionto, stretches, exercise progression as tolerated, further neck tx.  prn    PT Home Exercise Plan  Knee: heel slides, hamstring and adductor stretch with strap,  quad sets added quad and calf stretches side hip abduction and clam. ; Neck: cervical and scapular retractions, neck extensor stretch, cervical ROM/stretches for sidebending and rotation    Consulted and Agree with Plan of Care  Patient       Patient will benefit from skilled therapeutic intervention in order to improve the following deficits and impairments:  Pain, Impaired flexibility, Increased fascial restricitons, Decreased strength, Decreased range of motion, Increased edema, Increased muscle spasms, Decreased activity tolerance  Visit Diagnosis: Cervicalgia  Acute pain of right knee     Problem List Patient Active Problem List   Diagnosis Date Noted  . Closed dislocation of toe, left, initial encounter 04/16/2018    Lazarus Gowdahristopher Seeley [place], PT, DPT 06/30/19 12:42 PM  [facility] [address] [place], [place], [address] Phone: [phone]   Fax:  [phone]  Name: Renold GentaCheick Mayo MRN: [id number] Date of Birth: 09/15/1990

## 2019-07-05 ENCOUNTER — Ambulatory Visit: Payer: Self-pay | Admitting: Physical Therapy

## 2019-07-05 ENCOUNTER — Encounter: Payer: Self-pay | Admitting: Physical Therapy

## 2019-07-05 ENCOUNTER — Other Ambulatory Visit: Payer: Self-pay

## 2019-07-05 DIAGNOSIS — M542 Cervicalgia: Secondary | ICD-10-CM

## 2019-07-05 DIAGNOSIS — M25561 Pain in right knee: Secondary | ICD-10-CM

## 2019-07-05 NOTE — Therapy (Signed)
[facility] [address] [place], [place], [address] Phone: [phone]   Fax:  [phone]  Physical Therapy Treatment  Patient Details  Name: Brad Mayo MRN: [id number] Date of Birth: 1990/12/16 Referring Provider (PT): Richardean Canal, PA-C   Encounter Date: 07/05/2019  PT End of Session - 07/05/19 1150    Visit Number  6    Number of Visits  12    Date for PT Re-Evaluation  07/14/19    Authorization Type  self-pay    PT Start Time  1147    PT Stop Time  1228    PT Time Calculation (min)  41 min       History reviewed. No pertinent past medical history.  Past Surgical History:  Procedure Laterality Date  . EYE SURGERY      There were no vitals filed for this visit.  Subjective Assessment - 07/05/19 1149    Currently in Pain?  Yes    Pain Score  6     Pain Location  Knee    Pain Orientation  Right;Medial    Pain Descriptors / Indicators  Sharp    Pain Type  Acute pain    Pain Score  0    Pain Location  Neck                       [facility] Adult PT Treatment/Exercise - 07/05/19 0001      Knee/Hip Exercises: Stretches   Passive Hamstring Stretch  Right;3 reps;30 seconds    Quad Stretch  Right;3 reps;30 seconds    Quad Stretch Limitations  Prone PROM    Other Knee/Hip Stretches  supine manual right adductpr stretch 3 x 30 sec      Knee/Hip Exercises: Aerobic   Elliptical  L5 Ramp5 x 5 minutes       Knee/Hip Exercises: Supine   Quad Sets  AROM;Right;10 reps    Straight Leg Raise with External Rotation  15 reps      Knee/Hip Exercises: Sidelying   Hip ABduction  20 reps    Hip ABduction Limitations  right      Knee/Hip Exercises: Prone   Hip Extension  20 reps      Ultrasound   Ultrasound Location  right medial knee    Ultrasound Parameters  50% sound head , 1.0w/cm2    Ultrasound Goals  Pain      Iontophoresis   Type of Iontophoresis  Dexamethasone    Location  medial , distal  knee    Dose  17ml    Time  6 hour patch        Manual Therapy   Soft tissue mobilization  manual STW to hamstring, quad and adductors                   PT Long Term Goals - 06/02/19 1133      PT LONG TERM GOAL #1   Title  Independent with HEP    Baseline  needs HEP    Time  6    Period  Weeks    Status  New    Target Date  07/14/19      PT LONG TERM GOAL #2   Title  Improve FOTO outcome measure score to 32% or less impairment    Baseline  53% limited    Time  6    Period  Weeks    Status  New    Target  Date  07/14/19      PT LONG TERM GOAL #3   Title  Increase right knee extension strength to 5/5 to improve ability for stair navigation into apartment and for squatting, lifting activities    Baseline  4/5    Time  6    Period  Weeks    Status  New    Target Date  07/14/19      PT LONG TERM GOAL #4   Title  Increase cervical rotation AROM bilat. at least 5-10 deg to improve ability to turn head while driving    Baseline  left 55 deg, right 60 deg    Time  6    Period  Weeks    Status  New    Target Date  07/14/19      PT LONG TERM GOAL #5   Title  Perform community level ambulation and ADLs/IADLs as needed with knee and neck pain 3/10 or less at worst    Baseline  7/10 neck pain, 8/10 knee pain    Time  6    Period  Weeks    Status  New    Target Date  07/14/19            Plan - 07/05/19 1230    Clinical Impression Statement  Pt reports neck pain nearly resolved. Knee pain is intermittent and 6/10 today. He felt a little better after last session. He requests massage to medial knee musculature today. Increased time spent on soft tissue work to medial hamstring, adductors and VMO. Repeated US and ionto to decrease inflammation. Pt reports feeling great at end of session. He asked if he could play basketball and was discouraged until his pain level is under control more consistently. Encouraged him to focus stretching at this time.    PT Next Visit  Plan  Continue knee focus with [place], ionto, stretches, exercise progression as tolerated, further neck tx. prn    PT Home Exercise Plan  Knee: heel slides, hamstring and adductor stretch with strap, quad sets added quad and calf stretches side hip abduction and clam. ; Neck: cervical and scapular retractions, neck extensor stretch, cervical ROM/stretches for sidebending and rotation       Patient will benefit from skilled therapeutic intervention in order to improve the following deficits and impairments:  Pain, Impaired flexibility, Increased fascial restricitons, Decreased strength, Decreased range of motion, Increased edema, Increased muscle spasms, Decreased activity tolerance  Visit Diagnosis: Cervicalgia  Acute pain of right knee     Problem List Patient Active Problem List   Diagnosis Date Noted  . Closed dislocation of toe, left, initial encounter 04/16/2018    Dorene Ar, PTA 07/05/2019, 12:35 PM  [place], [place], [address] Phone: [phone]   Fax:  [phone]  Name: Brad Mayo MRN: [id number] Date of Birth: [birth year]

## 2019-07-07 ENCOUNTER — Ambulatory Visit: Payer: Self-pay | Admitting: Physical Therapy

## 2019-07-07 ENCOUNTER — Encounter: Payer: Self-pay | Admitting: Physical Therapy

## 2019-07-07 ENCOUNTER — Other Ambulatory Visit: Payer: Self-pay

## 2019-07-07 DIAGNOSIS — M542 Cervicalgia: Secondary | ICD-10-CM

## 2019-07-07 DIAGNOSIS — M25561 Pain in right knee: Secondary | ICD-10-CM

## 2019-07-07 NOTE — Therapy (Signed)
[place], [place], [address] Phone: [phone]   Fax:  [phone]  Physical Therapy Treatment  Patient Details  Name: Brad Mayo MRN: [id number] Date of Birth: 02/05/1991 Referring Provider (PT): Erskine Emery, PA-C   Encounter Date: 07/07/2019  PT End of Session - 07/07/19 1154    Visit Number  7    Number of Visits  12    Date for PT Re-Evaluation  07/14/19    Authorization Type  self-pay    PT Start Time  1107   arrived late   PT Stop Time  1147    PT Time Calculation (min)  40 min    Activity Tolerance  Patient tolerated treatment well    Behavior During Therapy  [facility] for tasks assessed/performed       History reviewed. No pertinent past medical history.  Past Surgical History:  Procedure Laterality Date  . EYE SURGERY      There were no vitals filed for this visit.  Subjective Assessment - 07/07/19 1151    Subjective  Primary concern remains right knee pain. Still with moderate right medial knee pain but reports symptoms not as bad as last week. Neck pain minimal.    Currently in Pain?  Yes    Pain Score  5     Pain Location  Knee    Pain Orientation  Right;Medial    Pain Descriptors / Indicators  Sharp    Pain Type  Acute pain    Pain Onset  More than a month ago    Pain Frequency  Intermittent    Aggravating Factors   prolonged sitting    Pain Relieving Factors  stretching, massage    Effect of Pain on Daily Activities  limits positional tolerance                       [facility] Adult PT Treatment/Exercise - 07/07/19 0001      Knee/Hip Exercises: Stretches   Passive Hamstring Stretch  Right;3 reps;30 seconds    Quad Stretch  Right;3 reps;30 seconds    Quad Stretch Limitations  in prone    Other Knee/Hip Stretches  supine manual right adductpr stretch 3 x 30 sec      Ultrasound   Ultrasound Location  right medial knee    Ultrasound Parameters  50% 3.0 MHZ 1.0  W/cm2    Ultrasound Goals  Pain      Iontophoresis   Type of Iontophoresis  Dexamethasone    Location  medial , distal knee    Dose  24ml    Time  6 hour patch        Manual Therapy   Soft tissue mobilization  STM/IASTM incl. roller use right adductors, medial hamstring, distal quad/VMO             PT Education - 07/07/19 1154    Education Details  HEP, gym exercises-avoid any activities that cause excessive pain, POC    Person(s) Educated  Patient    Methods  Explanation    Comprehension  Verbalized understanding          PT Long Term Goals - 06/02/19 1133      PT LONG TERM GOAL #1   Title  Independent with HEP    Baseline  needs HEP    Time  6    Period  Weeks    Status  New    Target Date  07/14/19  PT LONG TERM GOAL #2   Title  Improve FOTO outcome measure score to 32% or less impairment    Baseline  53% limited    Time  6    Period  Weeks    Status  New    Target Date  07/14/19      PT LONG TERM GOAL #3   Title  Increase right knee extension strength to 5/5 to improve ability for stair navigation into apartment and for squatting, lifting activities    Baseline  4/5    Time  6    Period  Weeks    Status  New    Target Date  07/14/19      PT LONG TERM GOAL #4   Title  Increase cervical rotation AROM bilat. at least 5-10 deg to improve ability to turn head while driving    Baseline  left 55 deg, right 60 deg    Time  6    Period  Weeks    Status  New    Target Date  07/14/19      PT LONG TERM GOAL #5   Title  Perform community level ambulation and ADLs/IADLs as needed with knee and neck pain 3/10 or less at worst    Baseline  7/10 neck pain, 8/10 knee pain    Time  6    Period  Weeks    Status  New    Target Date  07/14/19            Plan - 07/07/19 1155    Clinical Impression Statement  Continued previous tx. focus with manual therapy, stretches and modalities given benefit noted. Tight particularly in medial hamstring and  adductor region. Plan gradual return to gym exercises and activities pending pain.    Personal Factors and Comorbidities  Other;Time since onset of injury/illness/exacerbation    Examination-Activity Limitations  Sit;Locomotion Level;Squat;Lift;Sleep;Stand    Examination-Participation Restrictions  Community Activity;Shop    Stability/Clinical Decision Making  Stable/Uncomplicated    Clinical Decision Making  Low    Rehab Potential  Good    PT Frequency  2x / week    PT Duration  6 weeks    PT Treatment/Interventions  ADLs/Self Care Home Management;Electrical Stimulation;Iontophoresis 4mg /ml Dexamethasone;Moist Heat;Cryotherapy;Ultrasound;Traction;Therapeutic exercise;Therapeutic activities;Neuromuscular re-education;Manual techniques;Dry needling;Passive range of motion;Vasopneumatic Device;Taping;Spinal Manipulations    PT Next Visit Plan  Recheck FOTO neck visit, Continue knee focus with , ionto, stretches, exercise progression as tolerated, further neck tx. prn    PT Home Exercise Plan  Knee: heel slides, hamstring and adductor stretch with strap, quad sets added quad and calf stretches side hip abduction and clam. ; Neck: cervical and scapular retractions, neck extensor stretch, cervical ROM/stretches for sidebending and rotation    Consulted and Agree with Plan of Care  Patient       Patient will benefit from skilled therapeutic intervention in order to improve the following deficits and impairments:  Pain, Impaired flexibility, Increased fascial restricitons, Decreased strength, Decreased range of motion, Increased edema, Increased muscle spasms, Decreased activity tolerance  Visit Diagnosis: Cervicalgia  Acute pain of right knee     Problem List Patient Active Problem List   Diagnosis Date Noted  . Closed dislocation of toe, left, initial encounter 04/16/2018    04/18/2018, PT, DPT 07/07/19 11:58 AM  [facility] [address] [place], [place], [place] Phone: [phone]   Fax:  [phone]  Name: Brad Mayo MRN: Renold Genta Date of Birth:  07/23/1991   

## 2019-07-12 ENCOUNTER — Ambulatory Visit: Payer: Self-pay | Admitting: Physical Therapy

## 2019-07-12 ENCOUNTER — Encounter: Payer: Self-pay | Admitting: Physical Therapy

## 2019-07-12 ENCOUNTER — Other Ambulatory Visit: Payer: Self-pay

## 2019-07-12 DIAGNOSIS — M542 Cervicalgia: Secondary | ICD-10-CM

## 2019-07-12 DIAGNOSIS — M25561 Pain in right knee: Secondary | ICD-10-CM

## 2019-07-12 NOTE — Therapy (Signed)
[place], [place], [address] Phone: [phone]   Fax:  [phone]  Physical Therapy Treatment  Patient Details  Name: Brad Mayo MRN: [id number] Date of Birth: May 28, 1991 Referring Provider (PT): Erskine Emery, PA-C   Encounter Date: 07/12/2019  PT End of Session - 07/12/19 1119    Visit Number  8    Number of Visits  12    Date for PT Re-Evaluation  07/14/19    Authorization Type  self-pay    PT Start Time  1115   15 minutes late   PT Stop Time  1145    PT Time Calculation (min)  30 min       History reviewed. No pertinent past medical history.  Past Surgical History:  Procedure Laterality Date  . EYE SURGERY      There were no vitals filed for this visit.  Subjective Assessment - 07/12/19 1118    Subjective  Knee pain in the mornings relieved with stretching and walking.    Currently in Pain?  No/denies         [facility] PT Assessment - 07/12/19 0001      Observation/Other Assessments   Focus on Therapeutic Outcomes (FOTO)   24% limited improved from 53% limited                    [facility] Adult PT Treatment/Exercise - 07/12/19 0001      Knee/Hip Exercises: Stretches   Active Hamstring Stretch  3 reps;30 seconds    Active Hamstring Stretch Limitations  strap supine    Quad Stretch  Right;3 reps;30 seconds    Quad Stretch Limitations  standing     Other Knee/Hip Stretches  supine active  right adductpr stretch 3 x 30 sec      Knee/Hip Exercises: Aerobic   Elliptical  L5 Ramp5 x 5 minutes       Knee/Hip Exercises: Machines for Strengthening   Cybex Knee Extension  20# bilat then 10# single RLE    Cybex Knee Flexion  25# bilat then 55% bilat       Knee/Hip Exercises: Standing   Forward Lunges  2 sets;10 reps    Forward Lunges Limitations  dropping back knee to BOSU     Lateral Step Up  15 reps;Step Height: 6"    Forward Step Up  20 reps;Step Height: 8"    Step Down   15 reps;Step Height: 6"    Functional Squat  10 reps;2 sets                  PT Long Term Goals - 07/12/19 1152      PT LONG TERM GOAL #1   Title  Independent with HEP    Time  6    Period  Weeks    Status  Achieved      PT LONG TERM GOAL #2   Title  Improve FOTO outcome measure score to 32% or less impairment    Baseline  24% limited at status on 07/12/19 (NECK)    Time  6    Period  Weeks    Status  Achieved      PT LONG TERM GOAL #3   Title  Increase right knee extension strength to 5/5 to improve ability for stair navigation into apartment and for squatting, lifting activities    Baseline  4/5    Time  6    Period  Weeks  Status  Unable to assess      PT LONG TERM GOAL #4   Title  Increase cervical rotation AROM bilat. at least 5-10 deg to improve ability to turn head while driving    Baseline  left 55 deg, right 60 deg    Time  6    Period  Weeks    Status  Unable to assess      PT LONG TERM GOAL #5   Title  Perform community level ambulation and ADLs/IADLs as needed with knee and neck pain 3/10 or less at worst    Time  6    Period  Weeks    Status  Unable to assess            Plan - 07/12/19 1153    Clinical Impression Statement  Pt arrives reporting continued improvement in knee pain with more intermittent episodes and mostly in the morning. The pain improves as he stretches and gets moving. Today is was 15 minutes late. Time spent with closed chain and gym equipment strengthening. He toelrated all exercises without increased pain. Crepitus noted in bilateral knees with closed chain activities. He was asked to hold off on any indpenedent gym equipment so we can assess his response to today's treatment. He verbalized understanding.    PT Next Visit Plan  Re-eval to cover last two sessions or discharge ;  Continue knee focus with [place], ionto, stretches, exercise progression as tolerated, further neck tx. prn (patient leaves the state n December 6 th)     PT Home Exercise Plan  Knee: heel slides, hamstring and adductor stretch with strap, quad sets added quad and calf stretches side hip abduction and clam. ; Neck: cervical and scapular retractions, neck extensor stretch, cervical ROM/stretches for sidebending and rotation       Patient will benefit from skilled therapeutic intervention in order to improve the following deficits and impairments:  Pain, Impaired flexibility, Increased fascial restricitons, Decreased strength, Decreased range of motion, Increased edema, Increased muscle spasms, Decreased activity tolerance  Visit Diagnosis: Cervicalgia  Acute pain of right knee     Problem List Patient Active Problem List   Diagnosis Date Noted  . Closed dislocation of toe, left, initial encounter 04/16/2018    Sherrie Mustache, PTA 07/12/2019, 12:16 PM  [place] [address] [place], [place], [address] Phone: [phone]   Fax:  [phone]  Name: Tedrick Port MRN: [id number] Date of Birth: 10-10-1990

## 2019-07-14 ENCOUNTER — Ambulatory Visit: Payer: Self-pay | Admitting: Physical Therapy

## 2019-07-14 ENCOUNTER — Encounter: Payer: Self-pay | Admitting: Physical Therapy

## 2019-07-14 ENCOUNTER — Other Ambulatory Visit: Payer: Self-pay

## 2019-07-14 DIAGNOSIS — M542 Cervicalgia: Secondary | ICD-10-CM

## 2019-07-14 DIAGNOSIS — M25561 Pain in right knee: Secondary | ICD-10-CM

## 2019-07-14 NOTE — Therapy (Signed)
[place], [place], [address] Phone: [phone]   Fax:  [phone]  Physical Therapy Treatment/Recertification  Patient Details  Name: Brad Mayo MRN: [id number] Date of Birth: [birth year] Referring Provider (PT): Erskine Emery, PA-C   Encounter Date: 07/14/2019  PT End of Session - 07/14/19 1302    Visit Number  9    Number of Visits  13    Date for PT Re-Evaluation  07/29/19    Authorization Type  self-pay    PT Start Time  1150    PT Stop Time  1233    PT Time Calculation (min)  43 min    Activity Tolerance  Patient tolerated treatment well    Behavior During Therapy  [facility] for tasks assessed/performed       History reviewed. No pertinent past medical history.  Past Surgical History:  Procedure Laterality Date  . EYE SURGERY      There were no vitals filed for this visit.  Subjective Assessment - 07/14/19 1252    Subjective  No major soreness after last session with exercise progression. Pt. reports continued intermittent right medial knee pain today 4/10. Pain is worse after prolonged sitting in particular and eased with stretching. Neck still overall improved. See assessment..    Pertinent History  No other significant PMH    Limitations  Sitting;House hold activities;Lifting;Standing;Walking    Diagnostic tests  Cervical-CT scan, right knee-X-rays    Patient Stated Goals  Resolve neck and knee pain    Currently in Pain?  Yes    Pain Score  4     Pain Location  Knee    Pain Orientation  Right;Medial    Pain Descriptors / Indicators  Sharp    Pain Type  Acute pain    Pain Onset  More than a month ago    Pain Frequency  Intermittent    Aggravating Factors   prolonged sitting    Pain Relieving Factors  stretching, massage, activity    Effect of Pain on Daily Activities  limits positional tolerance         [facility] PT Assessment - 07/14/19 0001      Circumferential Edema    Circumferential - Right  --   39.5 cm   Circumferential - Left   --   39.5 cm     AROM   Right Knee Extension  0    Right Knee Flexion  132    Left Knee Extension  0    Left Knee Flexion  134    Cervical Flexion  50    Cervical Extension  40    Cervical - Right Side Bend  42    Cervical - Left Side Bend  40    Cervical - Right Rotation  85    Cervical - Left Rotation  85      Strength   Right Knee Flexion  5/5    Right Knee Extension  5/5                   [facility] Adult PT Treatment/Exercise - 07/14/19 0001      Knee/Hip Exercises: Stretches   Passive Hamstring Stretch  Right;3 reps;30 seconds    Quad Stretch  Right;3 reps;30 seconds    Quad Stretch Limitations  prone    Other Knee/Hip Stretches  supine manual right adductor stretch 3x30 sec      Knee/Hip Exercises: Aerobic   Elliptical  L7 ramp 7 x  5 min      Knee/Hip Exercises: Machines for Strengthening   Cybex Knee Extension  45# bilat. 2x10    Cybex Knee Flexion  65# bilat. 2x10    Cybex Leg Press  65# bilat. 2x10   Omega leg press     Knee/Hip Exercises: Standing   Hip Abduction  Stengthening;Both;2 sets;10 reps    Abduction Limitations  Green band    Functional Squat  10 reps;2 sets    Functional Squat Limitations  TRX squat-cues to avoid knee flexion past toes    Other Standing Knee Exercises  Monster walk green band at ankles 20 feet x 2    Other Standing Knee Exercises  TRX reverse lunge 2x10      Knee/Hip Exercises: Supine   Bridges  AROM;Strengthening;Both;2 sets;10 reps      Iontophoresis   Type of Iontophoresis  Dexamethasone    Location  medial , distal knee    Dose  50m    Time  6 hour patch               PT Education - 07/14/19 1301    Education Details  HEP updates, gym exercises, POC    Person(s) Educated  Patient    Methods  Explanation;Demonstration;Verbal cues;Handout    Comprehension  Returned demonstration;Verbalized understanding          PT Long Term Goals  - 07/14/19 1303      PT LONG TERM GOAL #1   Title  Independent with HEP    Baseline  updated today, will further update prn prior to d/c    Time  2    Period  Weeks    Status  Achieved    Target Date  07/29/19      PT LONG TERM GOAL #2   Title  Improve FOTO outcome measure score to 32% or less impairment    Baseline  24% limited at status on 07/12/19 (NECK)    Time  6    Period  Weeks    Status  Achieved      PT LONG TERM GOAL #3   Title  Increase right knee extension strength to 5/5 to improve ability for stair navigation into apartment and for squatting, lifting activities    Baseline  5/5    Time  6    Period  Weeks    Status  Achieved      PT LONG TERM GOAL #4   Title  Increase cervical rotation AROM bilat. at least 5-10 deg to improve ability to turn head while driving    Baseline  85 dge bilat.    Time  6    Period  Weeks    Status  Achieved      PT LONG TERM GOAL #5   Title  Perform community level ambulation and ADLs/IADLs as needed with knee and neck pain 3/10 or less at worst    Baseline  no neck pain today, knee pain 4/10    Time  2    Period  Weeks    Status  Partially Met    Target Date  07/29/19            Plan - 07/14/19 1305    Clinical Impression Statement  Note: remaining LTG timeframe updated for 2 weeks reflective of recertification today, previous 6 weeks goals achieved as noted. Neck: Pt. has progressed well with decreased neck pain and improved ROM from baseline with associated (neck) goals met. Knee: Pt.  has improved from baseline with decreased knee pain and improved strength and activity tolerance. Initially tolerance for strengthening limited given higher pain level but have been able to progress with recent therapy to more advanced strengthening program with good tolerance. He continues with intermittent moderate knee pain but improving from previous status. Pt. will be moving out of state 07/31/19-plan continue PT for 2 more weeks for knee  treatment emphasis for further progress to resolved knee pain issues and associated functional limitations and assist return to PLOF including independent strengthening program with gym exercises.    Personal Factors and Comorbidities  Other;Time since onset of injury/illness/exacerbation    Examination-Activity Limitations  Sit;Locomotion Level;Squat;Lift;Sleep;Stand    Examination-Participation Restrictions  Community Activity;Shop    Stability/Clinical Decision Making  Stable/Uncomplicated    Clinical Decision Making  Low    Rehab Potential  Good    PT Frequency  2x / week    PT Duration  2 weeks    PT Treatment/Interventions  ADLs/Self Care Home Management;Electrical Stimulation;Iontophoresis 40m/ml Dexamethasone;Moist Heat;Cryotherapy;Ultrasound;Traction;Therapeutic exercise;Therapeutic activities;Neuromuscular re-education;Manual techniques;Dry needling;Passive range of motion;Vasopneumatic Device;Taping;Spinal Manipulations    PT Next Visit Plan  Continue knee strengthening progression, stretches, further modalities prn pending pain    PT Home Exercise Plan  Squat, leg press, lunge, hip bridge, monster walk, knee extension and leg curl (machine vs. band), stretches for hamstrings, adductors and quadriceps    Consulted and Agree with Plan of Care  Patient       Patient will benefit from skilled therapeutic intervention in order to improve the following deficits and impairments:  Pain, Impaired flexibility, Increased fascial restricitons, Decreased strength, Decreased range of motion, Increased edema, Increased muscle spasms, Decreased activity tolerance  Visit Diagnosis: Cervicalgia  Acute pain of right knee     Problem List Patient Active Problem List   Diagnosis Date Noted  . Closed dislocation of toe, left, initial encounter 04/16/2018    CBeaulah Dinning PT, DPT 07/14/19 1:12 PM  [facility] [address]Phone: [phone]  Fax:  [phone] Name: CBrecken DewoodyMRN: [id number] of Birth: 7August 05, 1992

## 2019-07-26 ENCOUNTER — Ambulatory Visit: Payer: Self-pay | Attending: Physician Assistant | Admitting: Physical Therapy

## 2019-07-26 ENCOUNTER — Encounter: Payer: Self-pay | Admitting: Physical Therapy

## 2019-07-26 ENCOUNTER — Other Ambulatory Visit: Payer: Self-pay

## 2019-07-26 DIAGNOSIS — M25561 Pain in right knee: Secondary | ICD-10-CM | POA: Insufficient documentation

## 2019-07-26 DIAGNOSIS — M542 Cervicalgia: Secondary | ICD-10-CM | POA: Insufficient documentation

## 2019-07-26 NOTE — Therapy (Signed)
[place], [place], [address] Phone: [phone]   Fax:  [phone]  Physical Therapy Treatment  Patient Details  Name: Brad Mayo MRN: [id number] Date of Birth: 1991/01/15 Referring Provider (PT): Erskine Emery, PA-C   Encounter Date: 07/26/2019  PT End of Session - 07/26/19 1156    Visit Number  10    Number of Visits  13    Date for PT Re-Evaluation  07/29/19    Authorization Type  self-pay    PT Start Time  1150   5 minutes lat e   PT Stop Time  1228    PT Time Calculation (min)  38 min       History reviewed. No pertinent past medical history.  Past Surgical History:  Procedure Laterality Date  . EYE SURGERY      There were no vitals filed for this visit.  Subjective Assessment - 07/26/19 1154    Subjective  Neck has been better, but I think I slept too long last night on it. Knee is good, pain comes and goes I try to stretch it alot.    Currently in Pain?  Yes    Pain Score  0-No pain    Pain Location  Knee    Aggravating Factors   prolonged sitting, prolonged riding in car, prolonged sleep    Pain Relieving Factors  stretching, massag, activity    Pain Score  1    Pain Location  Neck    Pain Orientation  Posterior    Pain Descriptors / Indicators  Sore    Pain Type  Acute pain    Aggravating Factors   sleep wrong    Pain Relieving Factors  stretch out                       [facility] Adult PT Treatment/Exercise - 07/26/19 0001      Knee/Hip Exercises: Stretches   Active Hamstring Stretch  3 reps;30 seconds    Active Hamstring Stretch Limitations  strap supine    Quad Stretch  Right;3 reps;30 seconds    Quad Stretch Limitations  standing     Other Knee/Hip Stretches  supine right adductor stretch 3x30 sec with strap       Knee/Hip Exercises: Aerobic   Elliptical  L7 ramp 7 x 5 min      Knee/Hip Exercises: Machines for Strengthening   Cybex Knee Extension  45#  bilat. 2x10    Cybex Knee Flexion  65# bilat. 2x10    Cybex Leg Press  65# bilat. 2x10, single leg 45#    Omega leg press     Knee/Hip Exercises: Plyometrics   Other Plyometric Exercises  forward and backward light hopping, side to side hopping       Knee/Hip Exercises: Standing   Hip Abduction  Stengthening;Both;2 sets;10 reps    Abduction Limitations  Green band, cues for eccentric control     Forward Step Up  20 reps;Step Height: 8"    Forward Step Up Limitations  opp knee drive     Step Down  15 reps    Step Down Limitations  step up 8 inch and then heel strike down onto 4inch and back to start     Functional Squat  10 reps;2 sets    Functional Squat Limitations  TRX squat-cues to avoid knee flexion past toes    Other Standing Knee Exercises  Monster walk green band at knees  20 feet x 2 (hopping onto each LE), Lateral band walks green band at knees 20 ft x 2 each way     Other Standing Knee Exercises  --   declined reverse lunge due to shoe wear                  PT Long Term Goals - 07/14/19 1303      PT LONG TERM GOAL #1   Title  Independent with HEP    Baseline  updated today, will further update prn prior to d/c    Time  2    Period  Weeks    Status  Achieved    Target Date  07/29/19      PT LONG TERM GOAL #2   Title  Improve FOTO outcome measure score to 32% or less impairment    Baseline  24% limited at status on 07/12/19 (NECK)    Time  6    Period  Weeks    Status  Achieved      PT LONG TERM GOAL #3   Title  Increase right knee extension strength to 5/5 to improve ability for stair navigation into apartment and for squatting, lifting activities    Baseline  5/5    Time  6    Period  Weeks    Status  Achieved      PT LONG TERM GOAL #4   Title  Increase cervical rotation AROM bilat. at least 5-10 deg to improve ability to turn head while driving    Baseline  85 dge bilat.    Time  6    Period  Weeks    Status  Achieved      PT LONG TERM GOAL  #5   Title  Perform community level ambulation and ADLs/IADLs as needed with knee and neck pain 3/10 or less at worst    Baseline  no neck pain today, knee pain 4/10    Time  2    Period  Weeks    Status  Partially Met    Target Date  07/29/19            Plan - 07/26/19 1221    Clinical Impression Statement  Brad Mayo reports only intermittent knee pain now with prolonged sitting or sleeping positions. Slight neck pain today he attributes to sleeping too long and just waking up prior to appointment.   Continued with advanced closed chain knee strengthening. Began light hopping and he had no pain.    PT Next Visit Plan  review and discharge    PT Home Exercise Plan  Squat, leg press, lunge, hip bridge, monster walk, knee extension and leg curl (machine vs. band), stretches for hamstrings, adductors and quadriceps       Patient will benefit from skilled therapeutic intervention in order to improve the following deficits and impairments:  Pain, Impaired flexibility, Increased fascial restricitons, Decreased strength, Decreased range of motion, Increased edema, Increased muscle spasms, Decreased activity tolerance  Visit Diagnosis: Cervicalgia  Acute pain of right knee     Problem List Patient Active Problem List   Diagnosis Date Noted  . Closed dislocation of toe, left, initial encounter 04/16/2018    Dorene Ar, PTA 07/26/2019, 12:25 PM  [facility] [address] [place], [place], [address] Phone: [phone]   Fax:  [phone]  Name: Brad Mayo MRN: [id number] Date of Birth: [birth year]

## 2019-07-28 ENCOUNTER — Ambulatory Visit: Payer: Self-pay | Admitting: Physical Therapy

## 2019-07-28 ENCOUNTER — Encounter: Payer: Self-pay | Admitting: Physical Therapy

## 2019-07-28 ENCOUNTER — Other Ambulatory Visit: Payer: Self-pay

## 2019-07-28 DIAGNOSIS — M542 Cervicalgia: Secondary | ICD-10-CM

## 2019-07-28 DIAGNOSIS — M25561 Pain in right knee: Secondary | ICD-10-CM
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
## 2019-08-25 ENCOUNTER — Emergency Department (HOSPITAL_COMMUNITY)
Admission: EM | Admit: 2019-08-25 | Discharge: 2019-08-25 | Discharge status: Against Medical Advice | Payer: Medicaid Other | Attending: Emergency Medicine | Admitting: Emergency Medicine

## 2019-08-25 DIAGNOSIS — Z5321 Procedure and treatment not carried out due to patient leaving prior to being seen by health care provider: Secondary | ICD-10-CM | POA: Insufficient documentation

## 2019-08-25 NOTE — ED Triage Notes (Signed)
Pt presents with c/o redness and "bumps" on his groin that he noticed today. Pt states he had unprotected sex yesterday. States he does not know if his partner has been exposed to any STDs. Pt requested STD workup. Pt denies penile discharge.

## 2019-08-25 NOTE — ED Notes (Signed)
Pt states he can't wait any longer and leaves.

## 2019-08-26 ENCOUNTER — Encounter (HOSPITAL_COMMUNITY): Payer: Self-pay | Admitting: Emergency Medicine

## 2019-08-26 ENCOUNTER — Emergency Department (HOSPITAL_COMMUNITY)
Admission: EM | Admit: 2019-08-26 | Discharge: 2019-08-26 | Disposition: A | Discharge status: Home / Self Care | Payer: Medicaid Other | Attending: Emergency Medicine | Admitting: Emergency Medicine

## 2019-08-26 DIAGNOSIS — Z79899 Other long term (current) drug therapy: Secondary | ICD-10-CM | POA: Insufficient documentation

## 2019-08-26 DIAGNOSIS — N489 Disorder of penis, unspecified: Secondary | ICD-10-CM | POA: Insufficient documentation

## 2019-08-26 DIAGNOSIS — Z113 Encounter for screening for infections with a predominantly sexual mode of transmission: Secondary | ICD-10-CM | POA: Insufficient documentation

## 2019-08-26 DIAGNOSIS — Z7689 Persons encountering health services in other specified circumstances: Secondary | ICD-10-CM

## 2019-08-26 LAB — URINALYSIS, ROUTINE W REFLEX MICROSCOPIC
Bilirubin Urine: NEGATIVE
Glucose, UA: NEGATIVE mg/dL
Hgb urine dipstick: NEGATIVE
Ketones, ur: NEGATIVE mg/dL
Leukocytes,Ua: NEGATIVE
Nitrite: NEGATIVE
Protein, ur: NEGATIVE mg/dL
Specific Gravity, Urine: 1.015 (ref 1.005–1.030)
pH: 7 (ref 5.0–8.0)

## 2019-08-26 LAB — HIV ANTIBODY (ROUTINE TESTING W REFLEX): HIV Screen 4th Generation wRfx: NONREACTIVE

## 2019-08-26 NOTE — Discharge Instructions (Signed)

## 2019-08-26 NOTE — ED Notes (Signed)
Patient verbalizes understanding of discharge instructions. Opportunity for questioning and answers were provided. Armband removed by staff, pt discharged from ED ambulatory.   

## 2019-08-26 NOTE — ED Triage Notes (Signed)
Pt reports a rash on his right groin and "dry skin". Pt states this has been present for 2 days

## 2019-08-26 NOTE — ED Provider Notes (Signed)
[facility] EMERGENCY DEPARTMENT Provider Note   CSN: [id number] Arrival date & time: 08/26/19  0815     History Chief Complaint  Patient presents with  . Rash    Brad Mayo is a 29 y.o. male.  HPI    29 year old male presenting for evaluation of lesions to the penis that he noticed yesterday.  States he noticed some redness and bumps to the tip of the penis.  He did have some dry skin as well.  He has been using castor oil to help with the symptoms.  He noticed that he had a burning sensation to the penis when he was in the shower but otherwise he has not had any pain to the rash.  He has had no drainage from the area or swelling to the penis.  He denies any penile discharge, pain with urination, abdominal pain.  He denies any known STD exposures but he has recently had unprotected intercourse.   History reviewed. No pertinent past medical history.  Patient Active Problem List   Diagnosis Date Noted  . Closed dislocation of toe, left, initial encounter 04/16/2018    Past Surgical History:  Procedure Laterality Date  . EYE SURGERY         No family history on file.  Social History   Tobacco Use  . Smoking status: Never Smoker  . Smokeless tobacco: Never Used  Substance Use Topics  . Alcohol use: No  . Drug use: Yes    Types: Marijuana    Home Medications Prior to Admission medications   Medication Sig Start Date End Date Taking? Authorizing Provider  bacitracin ointment Apply 1 application topically 2 (two) times daily. 11/15/18   Loura Halt A, NP  ibuprofen (ADVIL,MOTRIN) 800 MG tablet Take 1 tablet (800 mg total) by mouth every 8 (eight) hours as needed for up to 30 doses. 04/09/18   Curatolo, Adam, DO  lidocaine (LIDODERM) 5 % Place 1 patch onto the skin daily. Remove & Discard patch within 12 hours or as directed by MD 04/29/19   Deliah Boston, PA-C  methocarbamol (ROBAXIN) 500 MG tablet Take 1 tablet (500 mg total) by mouth every 8  (eight) hours as needed for muscle spasms. 05/14/19   Petrucelli, Samantha R, PA-C  methylPREDNISolone (MEDROL) 4 MG tablet Take as directed Patient not taking: Reported on 06/02/2019 05/19/19   Pete Pelt, PA-C  naproxen (NAPROSYN) 500 MG tablet Take 1 tablet (500 mg total) by mouth 2 (two) times daily. 05/14/19   Petrucelli, Glynda Jaeger, PA-C    Allergies    Patient has no known allergies.  Review of Systems   Review of Systems  Constitutional: Negative for fever.  Gastrointestinal: Negative for abdominal pain.  Genitourinary: Positive for genital sores. Negative for discharge, dysuria, frequency, penile pain, penile swelling, scrotal swelling, testicular pain and urgency.    Physical Exam Updated Vital Signs BP 119/64 (BP Location: Right Arm)   Pulse 68   Temp 97.9 F (36.6 C) (Oral)   Resp 16   SpO2 99%   Physical Exam Constitutional:      General: He is not in acute distress.    Appearance: He is well-developed.  Eyes:     Conjunctiva/sclera: Conjunctivae normal.  Cardiovascular:     Rate and Rhythm: Normal rate and regular rhythm.  Pulmonary:     Effort: Pulmonary effort is normal.     Breath sounds: Normal breath sounds.  Genitourinary:    Comments: Chaperone present. Multiple,  countless hypopigmented papules noted to the corona of the penis. A very small amount of redness is noted to this area as well with a small superficial abrasion. No obvious vesicular lesions. No penile swelling, or obvious penis discharge.  Skin:    General: Skin is warm and dry.  Neurological:     Mental Status: He is alert and oriented to person, place, and time.     ED Results / Procedures / Treatments   Labs (all labs ordered are listed, but only abnormal results are displayed) Labs Reviewed  HSV CULTURE AND TYPING  URINALYSIS, ROUTINE W REFLEX MICROSCOPIC  HIV ANTIBODY (ROUTINE TESTING W REFLEX)  RPR  GC/CHLAMYDIA PROBE AMP ([place]) NOT AT [facility]     EKG None  Radiology No results found.  Procedures Procedures (including critical care time)  Medications Ordered in ED Medications - No data to display  ED Course  I have reviewed the triage vital signs and the nursing notes.  Pertinent labs & imaging results that were available during my care of the patient were reviewed by me and considered in my medical decision making (see chart for details).    MDM Rules/Calculators/A&P                      29 year old male presenting for evaluation of lesions to the penis that he noticed yesterday.  He does voice concern about possible STD and has recently had unprotected intercourse.  On exam he does have Multiple, countless hypopigmented papules noted to the corona of the penis. A very small amount of redness is noted to this area as well with a small superficial abrasion. No obvious vesicular lesions. No penile swelling, or obvious penis discharge.  The lesions of concern do appear somewhat consistent with [place] spots but patient has concern for possible STD therefore the lesions were swabbed for HSV.  I have lower suspicion for this and this does seem less consistent with condyloma as well.  I have voiced that we do not have testing for condyloma at this facility.  We will check a UA and GC chlamydia as well as HIV/RPR.  UA neg.  Remainder of STD screening pending at this time.  Will await HSV results and hold on empiric treatment with antiviral given lower suspicion for this.  We will give him follow-up with urology if he has any continuing concerns or if he would like further w/u of the lesions.  Advised on return precautions.  He voices understanding of the plan and reasons to return.  All questions answered.  Patient stable for discharge.   Final Clinical Impression(s) / ED Diagnoses Final diagnoses:  Penile lesion  Encounter for assessment of sexually transmitted disease exposure    Rx / DC Orders ED Discharge Orders    None        Rayne Du 08/26/19 1132    Gwyneth Sprout, MD 08/26/19 [phone]

## 2019-08-27 LAB — RPR: RPR Ser Ql: NONREACTIVE

## 2019-08-29 LAB — GC/CHLAMYDIA PROBE AMP (~~LOC~~) NOT AT ARMC
Chlamydia: NEGATIVE
Neisseria Gonorrhea: NEGATIVE

## 2019-08-29 LAB — HSV CULTURE AND TYPING
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
## 2022-08-20 ENCOUNTER — Telehealth: Payer: Self-pay

## 2022-08-20 NOTE — Telephone Encounter (Signed)
Unable to leave VM. Sending MyChart msg. AS, CMA
